# Patient Record
Sex: Male | Born: 1995 | Race: White | Hispanic: No | Marital: Single | Smoking: Current every day smoker
Health system: Southern US, Community
[De-identification: ages and names within clinical notes are randomized; demographics above are authoritative.]

## PROBLEM LIST (undated history)

## (undated) DIAGNOSIS — R002 Palpitations: Secondary | ICD-10-CM

## (undated) DIAGNOSIS — F1210 Cannabis abuse, uncomplicated: Secondary | ICD-10-CM

## (undated) DIAGNOSIS — Z720 Tobacco use: Secondary | ICD-10-CM

## (undated) HISTORY — DX: Palpitations: R00.2

## (undated) HISTORY — DX: Cannabis abuse, uncomplicated: F12.10

## (undated) HISTORY — DX: Tobacco use: Z72.0

## (undated) HISTORY — PX: OTHER SURGICAL HISTORY: SHX169

---
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
## 2017-05-23 ENCOUNTER — Emergency Department
Admission: EM | Admit: 2017-05-23 | Discharge: 2017-05-23 | Disposition: A | Discharge status: Home / Self Care | Payer: Self-pay | Attending: Emergency Medicine | Admitting: Emergency Medicine

## 2017-05-23 ENCOUNTER — Emergency Department: Payer: Self-pay

## 2017-05-23 DIAGNOSIS — F17210 Nicotine dependence, cigarettes, uncomplicated: Secondary | ICD-10-CM | POA: Insufficient documentation

## 2017-05-23 DIAGNOSIS — R0789 Other chest pain: Secondary | ICD-10-CM | POA: Insufficient documentation

## 2017-05-23 LAB — CBC
HEMATOCRIT: 42.3 % (ref 40.0–52.0)
Hemoglobin: 14.6 g/dL (ref 13.0–18.0)
MCH: 30.5 pg (ref 26.0–34.0)
MCHC: 34.6 g/dL (ref 32.0–36.0)
MCV: 88 fL (ref 80.0–100.0)
PLATELETS: 216 10*3/uL (ref 150–440)
RBC: 4.8 MIL/uL (ref 4.40–5.90)
RDW: 13.3 % (ref 11.5–14.5)
WBC: 7.8 10*3/uL (ref 3.8–10.6)

## 2017-05-23 LAB — BASIC METABOLIC PANEL
Anion gap: 9 (ref 5–15)
BUN: 10 mg/dL (ref 6–20)
CHLORIDE: 99 mmol/L — AB (ref 101–111)
CO2: 28 mmol/L (ref 22–32)
CREATININE: 1.01 mg/dL (ref 0.61–1.24)
Calcium: 9.3 mg/dL (ref 8.9–10.3)
GFR calc Af Amer: >60 mL/min (ref >60)
GFR calc non Af Amer: >60 mL/min (ref >60)
GLUCOSE: 55 mg/dL — AB (ref 65–99)
Potassium: 3.5 mmol/L (ref 3.5–5.1)
Sodium: 136 mmol/L (ref 135–145)

## 2017-05-23 LAB — TROPONIN I: Troponin I: <0.03 ng/mL (ref <0.03)

## 2017-05-23 MED ORDER — NICOTINE 14 MG/24HR TD PT24: 14.0000 mg | MEDICATED_PATCH | TRANSDERMAL | 0 refills | Status: DC

## 2017-05-23 NOTE — ED Provider Notes (Signed)
[facility] Emergency Department Provider Note       Time seen: ----------------------------------------- 7:32 PM on 05/23/2017 -----------------------------------------     I have reviewed the triage vital signs and the nursing notes.   HISTORY   Chief Complaint Chest Pain    HPI Jeff [name] is a 21 y.o. male who presents to the ED for chest pain that he has had intermittently for 2-3 weeks. Patient reports approximately a month ago he started on off with chest pain that feels like a pressure. He has had some shortness of breath, admits to daily cigarette use but otherwise no cardiac risk factors. He denies fevers, chills or recent illness.   History reviewed. No pertinent past medical history.  There are no active problems to display for this patient.   History reviewed. No pertinent surgical history.  Allergies Patient has no known allergies.  Social History Social History  Substance Use Topics  . Smoking status: Current Every Day Smoker    Packs/day: 0.50    Types: Cigarettes  . Smokeless tobacco: Never Used  . Alcohol use Yes     Comment: socially 2xmonth    Review of Systems Constitutional: Negative for fever. Eyes: Negative for vision changes ENT:  Negative for congestion, sore throat Cardiovascular: Positive for chest pain Respiratory: Positive for shortness of breath Gastrointestinal: Negative for abdominal pain, vomiting and diarrhea. Genitourinary: Negative for dysuria. Musculoskeletal: Negative for back pain. Skin: Negative for rash. Neurological: Negative for headaches, focal weakness or numbness.  All systems negative/normal/unremarkable except as stated in the HPI  ____________________________________________   PHYSICAL EXAM:  VITAL SIGNS: ED Triage Vitals  Enc Vitals Group     BP 05/23/17 1757 (!) 141/79     Pulse Rate 05/23/17 1757 (!) 116     Resp 05/23/17 1757 16     Temp 05/23/17 1757 98.4 F (36.9  C)     Temp Source 05/23/17 1757 Oral     SpO2 05/23/17 1757 98 %     Weight 05/23/17 1757 135 lb (61.2 kg)     Height 05/23/17 1757 6' (1.829 m)     Head Circumference --      Peak Flow --      Pain Score 05/23/17 1756 5     Pain Loc --      Pain Edu? --      Excl. in GC? --     Constitutional: Alert and oriented. Well appearing and in no distress. Eyes: Conjunctivae are normal. Normal extraocular movements. ENT   Head: Normocephalic and atraumatic.   Nose: No congestion/rhinnorhea.   Mouth/Throat: Mucous membranes are moist.   Neck: No stridor. Cardiovascular: Normal rate, regular rhythm. No murmurs, rubs, or gallops. Respiratory: Normal respiratory effort without tachypnea nor retractions. Breath sounds are clear and equal bilaterally. No wheezes/rales/rhonchi. Gastrointestinal: Soft and nontender. Normal bowel sounds Musculoskeletal: Nontender with normal range of motion in extremities. No lower extremity tenderness nor edema. Neurologic:  Normal speech and language. No gross focal neurologic deficits are appreciated.  Skin:  Skin is warm, dry and intact. No rash noted. Psychiatric: Mood and affect are normal. Speech and behavior are normal.  ____________________________________________  EKG: Interpreted by me. Sinus rhythm with a rate of 93 bpm, normal PR interval, normal QRS, normal QT, normal axis.  ____________________________________________  ED COURSE:  Pertinent labs & imaging results that were available during my care of the patient were reviewed by me and considered in my medical decision making (see chart for  details). Patient presents for chest pain, we will assess with labs and imaging as indicated.   Procedures ____________________________________________   LABS (pertinent positives/negatives)  Labs Reviewed  BASIC METABOLIC PANEL - Abnormal; Notable for the following:       Result Value   Chloride 99 (*)    Glucose, Bld 55 (*)    All  other components within normal limits  CBC  TROPONIN I    RADIOLOGY Images were viewed by me  Chest x-ray is unremarkable  ____________________________________________  FINAL ASSESSMENT AND PLAN  Chest wall pain  Plan: Patient's labs and imaging were dictated above. Patient had presented for chest pain likely secondary to costochondritis. Encouraged to take NSAIDs, I will prescribe nicotine patches for smoking cessation.  Emily FilbertWilliams, Jhair Witherington E, MD   Note: This note was generated in part or whole with voice recognition software. Voice recognition is usually quite accurate but there are transcription errors that can and very often do occur. I apologize for any typographical errors that were not detected and corrected.     Emily FilbertWilliams, Wetzel Meester E, MD 05/23/17 [phone]

## 2017-05-23 NOTE — ED Triage Notes (Signed)
Pt reports that approx a month ago he started with off and on chest pain that feels like pressure in the center of his chest - he denies anxiety but reports shortness of breath - at this time respirations are even and unlabored with no signs of distress - pt denies N/V - pt denies lightheadedness

## 2017-05-23 NOTE — ED Notes (Signed)

## 2017-09-28 ENCOUNTER — Emergency Department
Admission: EM | Admit: 2017-09-28 | Discharge: 2017-09-28 | Disposition: A | Discharge status: Home / Self Care | Payer: Self-pay | Attending: Emergency Medicine | Admitting: Emergency Medicine

## 2017-09-28 ENCOUNTER — Encounter: Payer: Self-pay | Admitting: Emergency Medicine

## 2017-09-28 DIAGNOSIS — K029 Dental caries, unspecified: Secondary | ICD-10-CM | POA: Insufficient documentation

## 2017-09-28 DIAGNOSIS — R05 Cough: Secondary | ICD-10-CM | POA: Insufficient documentation

## 2017-09-28 DIAGNOSIS — K047 Periapical abscess without sinus: Secondary | ICD-10-CM | POA: Insufficient documentation

## 2017-09-28 DIAGNOSIS — B349 Viral infection, unspecified: Secondary | ICD-10-CM | POA: Insufficient documentation

## 2017-09-28 DIAGNOSIS — J069 Acute upper respiratory infection, unspecified: Secondary | ICD-10-CM | POA: Insufficient documentation

## 2017-09-28 DIAGNOSIS — B9789 Other viral agents as the cause of diseases classified elsewhere: Secondary | ICD-10-CM

## 2017-09-28 DIAGNOSIS — B370 Candidal stomatitis: Secondary | ICD-10-CM | POA: Insufficient documentation

## 2017-09-28 DIAGNOSIS — F17210 Nicotine dependence, cigarettes, uncomplicated: Secondary | ICD-10-CM | POA: Insufficient documentation

## 2017-09-28 MED ORDER — AMOXICILLIN 875 MG PO TABS: 875.0000 mg | ORAL_TABLET | Freq: Two times a day (BID) | ORAL | 0 refills | Status: DC

## 2017-09-28 MED ORDER — PSEUDOEPH-BROMPHEN-DM 30-2-10 MG/5ML PO SYRP: 10.0000 mL | ORAL_SOLUTION | Freq: Four times a day (QID) | ORAL | 0 refills | Status: DC | PRN

## 2017-09-28 MED ORDER — MAGIC MOUTHWASH W/LIDOCAINE: 5.0000 mL | Freq: Four times a day (QID) | ORAL | 0 refills | Status: DC

## 2017-09-28 NOTE — ED Provider Notes (Signed)
[facility] Emergency Department Provider Note  ____________________________________________  Time seen: Approximately 5:15 PM  I have reviewed the triage vital signs and the nursing notes.   HISTORY  Chief Complaint Dental Pain    HPI Jeff [name] is a 21 y.o. male presents emergency Department with multiple complaints. Patient reports he has had nasal congestion, sore throat, coughing times. Yesterday he developed right lower dental pain. Patient reports that he has poor dentition but does not have a dentist. His reported pain and swelling to the right lower jaw. Patient has tried Tylenol, Motrin, and BC powders at home. Patient denies any fevers or chills, difficulty breathing or swallowing. No chest pain, short of breath, abdominal pain, nausea vomiting.   History reviewed. No pertinent past medical history.  There are no active problems to display for this patient.   History reviewed. No pertinent surgical history.  Prior to Admission medications   Medication Sig Start Date End Date Taking? Authorizing Provider  amoxicillin (AMOXIL) 875 MG tablet Take 1 tablet (875 mg total) by mouth 2 (two) times daily. 09/28/17   Alainah Phang, Delorise Royals, PA-C  brompheniramine-pseudoephedrine-DM 30-2-10 MG/5ML syrup Take 10 mLs by mouth 4 (four) times daily as needed. 09/28/17   Mikhael Hendriks, Delorise Royals, PA-C  magic mouthwash w/lidocaine SOLN Take 5 mLs by mouth 4 (four) times daily. Swish, gargle and spit 09/28/17   Lilian Fuhs, Delorise Royals, PA-C  nicotine (NICODERM CQ - DOSED IN MG/24 HOURS) 14 mg/24hr patch Place 1 patch (14 mg total) onto the skin daily. 05/23/17 05/23/18  Emily Filbert, MD    Allergies Patient has no known allergies.  No family history on file.  Social History Social History  Substance Use Topics  . Smoking status: Current Every Day Smoker    Packs/day: 0.50    Types: Cigarettes  . Smokeless tobacco: Never Used  . Alcohol use Yes   Comment: socially 2xmonth     Review of Systems  Constitutional: No fever/chills Eyes: No visual changes. No discharge ENT: positive for nasal congestion and sore throat. Positive for right lower dental pain. Cardiovascular: no chest pain. Respiratory:positive cough. No SOB. Gastrointestinal: No abdominal pain.  No nausea, no vomiting.  No diarrhea.  No constipation. Musculoskeletal: Negative for musculoskeletal pain. Skin: Negative for rash, abrasions, lacerations, ecchymosis. Neurological: Negative for headaches, focal weakness or numbness. 10-point ROS otherwise negative.  ____________________________________________   PHYSICAL EXAM:  VITAL SIGNS: ED Triage Vitals  Enc Vitals Group     BP 09/28/17 1707 (!) 133/96     Pulse Rate 09/28/17 1707 (!) 110     Resp 09/28/17 1707 18     Temp 09/28/17 1707 98.6 F (37 C)     Temp Source 09/28/17 1707 Oral     SpO2 09/28/17 1707 98 %     Weight 09/28/17 1708 135 lb (61.2 kg)     Height 09/28/17 1708  (1.803 m)     Head Circumference --      Peak Flow --      Pain Score 09/28/17 1706 10     Pain Loc --      Pain Edu? --      Excl. in GC? --      Constitutional: Alert and oriented. Well appearing and in no acute distress. Eyes: Conjunctivae are normal. PERRL. EOMI. Head: Atraumatic. ENT:      Ears: EACs with cerumen bilaterally. Mild abrasion noted to the left EAC from Q-tip use. TMs are visualized bilaterally, mildly  bulging, nonerythematous with no mucoid air-fluid level.      Nose: mild clear congestion/rhinnorhea.      Mouth/Throat: Mucous membranes are moist. Oropharynx is mildly erythematous but nonedematous. Uvula is midline. Tonsils are mildly erythematous bilaterally. No exudates. Patient has white plaque-liketongue lesion consistent with thrush. Patient has multiple caries, dental erosions, dental fractures throughout dentition. Patient is missing the second molar right lower dentition, this area is  erythematous, edematous consistent with infection. No signs of abscess. Neck: No stridor. Eck is supple for range of motion Hematological/Lymphatic/Immunilogical: No cervical lymphadenopathy. Cardiovascular: Normal rate, regular rhythm. Normal S1 and S2.  Good peripheral circulation. Respiratory: Normal respiratory effort without tachypnea or retractions. Lungs CTAB. Good air entry to the bases with no decreased or absent breath sounds. Musculoskeletal: Full range of motion to all extremities. No gross deformities appreciated. Neurologic:  Normal speech and language. No gross focal neurologic deficits are appreciated.  Skin:  Skin is warm, dry and intact. No rash noted. Psychiatric: Mood and affect are normal. Speech and behavior are normal. Patient exhibits appropriate insight and judgement.   ____________________________________________   LABS (all labs ordered are listed, but only abnormal results are displayed)  Labs Reviewed - No data to display ____________________________________________  EKG   ____________________________________________  RADIOLOGY   No results found.  ____________________________________________    PROCEDURES  Procedure(s) performed:    Procedures    Medications - No data to display   ____________________________________________   INITIAL IMPRESSION / ASSESSMENT AND PLAN / ED COURSE  Pertinent labs & imaging results that were available during my care of the patient were reviewed by me and considered in my medical decision making (see chart for details).  Review of the [place] CSRS was performed in accordance of the [facility] prior to dispensing any controlled drugs.     Patient's diagnosis is consistent with viral upper respiratory infection, oral thrush, and dental infection with multiple dental caries.. Patient will be discharged home with prescriptions for Bromfed cough syrup for viral URI,amoxicillin for dental infection, magic mouthwash  for thrush and dental pain.. Patient is to follow up with dentist and/or primary care as needed or otherwise directed. Patient is given ED precautions to return to the ED for any worsening or new symptoms.     ____________________________________________  FINAL CLINICAL IMPRESSION(S) / ED DIAGNOSES  Final diagnoses:  Dental infection  Dental caries  Thrush  Viral URI with cough      NEW MEDICATIONS STARTED DURING THIS VISIT:  New Prescriptions   AMOXICILLIN (AMOXIL) 875 MG TABLET    Take 1 tablet (875 mg total) by mouth 2 (two) times daily.   BROMPHENIRAMINE-PSEUDOEPHEDRINE-DM 30-2-10 MG/5ML SYRUP    Take 10 mLs by mouth 4 (four) times daily as needed.   MAGIC MOUTHWASH W/LIDOCAINE SOLN    Take 5 mLs by mouth 4 (four) times daily. Swish, gargle and spit        This chart was dictated using voice recognition software/Dragon. Despite best efforts to proofread, errors can occur which can change the meaning. Any change was purely unintentional.    Racheal Patches, PA-C 09/28/17 1738    Sharman Cheek, MD 09/28/17 1843

## 2017-09-28 NOTE — Discharge Instructions (Signed)
OPTIONS FOR DENTAL FOLLOW UP CARE ° °[place] Department of Health and Human Services - Local Safety Net Dental Clinics °http://www.ncdhhs.gov/dph/oralhealth/services/safetynetclinics.htm °  °[facility] ([phone]) ° °[facility] ([phone]) ° °[facility] ([phone] ext [phone]) ° °[facility] ([phone]) ° °[facility] ([phone]) °This clinic caters to the indigent population and is on a lottery system. °Location: °[facility], [address], [place] °Clinic Hours: °Wednesdays from 6pm - 9pm, patients seen by a lottery system. °For dates, call or go to [email or web address] °Services: °Cleanings, fillings and simple extractions. °Payment Options: °DENTAL WORK IS FREE OF CHARGE. Bring proof of income or support. °Best way to get seen: °Arrive at 5:15 pm - this is a lottery, NOT first come/first serve, so arriving earlier will not increase your chances of being seen. °  °  °[facility] °[phone] °Select option 1 for emergencies °  °Location: °[facility], [address], [place] °Clinic Hours: °No walk-ins accepted - call the day before to schedule an appointment. °Check in times are 9:30 am and 1:30 pm. °Services: °Simple extractions, temporary fillings, pulpectomy/pulp debridement, uncomplicated abscess drainage. °Payment Options: °PAYMENT IS DUE AT THE TIME OF SERVICE.  Fee is usually $100-200, additional surgical procedures (e.g. abscess drainage) may be extra. °Cash, checks, Visa/MasterCard accepted.  Can file Medicaid if patient is covered for dental - patient should call case worker to check. °No discount for [facility] patients. °Best way to get seen: °MUST call the day before and get onto the schedule. Can usually be seen the next 1-2 days. No walk-ins accepted. °  °  °[facility] °[phone] °   °Location: °[facility], [address], [place] °Clinic Hours: °M, W, Th, F 8am or 1:30pm, Tues 9a or 1:30 - first come/first served. °Services: °Simple extractions, temporary fillings, uncomplicated abscess drainage.  You do not need to be an [place] resident. °Payment Options: °PAYMENT IS DUE AT THE TIME OF SERVICE. °Dental insurance, otherwise sliding scale - bring proof of income or support. °Depending on income and treatment needed, cost is usually $50-200. °Best way to get seen: °Arrive early as it is first come/first served. °  °  °[facility] °[phone] °  °Location: °[address] °Clinic Hours: °Mon-Thu 8a-5p °Services: °Most basic dental services including extractions and fillings. °Payment Options: °PAYMENT IS DUE AT THE TIME OF SERVICE. °Sliding scale, up to 50% off - bring proof if income or support. °Medicaid with dental option accepted. °Best way to get seen: °Call to schedule an appointment, can usually be seen within 2 weeks OR they will try to see walk-ins - show up at 8a or 2p (you may have to wait). °  °  °[facility] °[phone] °[place] RESIDENTS ONLY °  °Location: °[facility], [address], [place], [place] [address] °Clinic Hours: By appointment only. °Monday - Thursday 8am-5pm, Friday 8am-12pm °Services: Cleanings, fillings, extractions. °Payment Options: °PAYMENT IS DUE AT THE TIME OF SERVICE. °Cash, Visa or MasterCard. Sliding scale - $30 minimum per service. °Best way to get seen: °Come in to office, complete packet and make an appointment - need proof of income °or support monies for each household member and proof of [place] residence. °Usually takes about a month to get in. °  °  °[facility] °[phone] °  °Location: °[address]   [place] °Clinic Hours: Walk-in Urgent Care Dental Services are offered Monday-Friday  mornings only. °The numbers of emergencies accepted daily is limited to the number of °providers available. °Maximum 15 - Mondays, Wednesdays & Thursdays °Maximum 10 - Tuesdays & Fridays °Services: °You do not need to be a [place] resident to be seen for a dental emergency. °Emergencies are defined as pain, swelling, abnormal bleeding, or dental trauma. Walkins will receive x-rays if needed. °NOTE: Dental cleaning is not an emergency. °Payment Options: °PAYMENT IS DUE AT THE TIME OF SERVICE. °Minimum co-pay is $40.00 for uninsured patients. °Minimum co-pay is $3.00 for Medicaid with dental coverage. °Dental Insurance is accepted and must be presented at time of visit. °Medicare does not cover dental. °Forms of payment: Cash, credit card, checks. °Best way to get seen: °If not previously registered with the clinic, walk-in dental registration begins at 7:15 am and is on a first come/first serve basis. °If previously registered with the clinic, call to make an appointment. °  °  °[facility] °[phone] °[place] RESIDENTS ONLY °  °Location: °[address], [place], [place] °Clinic Hours: °Mon-Thu 10a-2p °Services: Extractions only! °Payment Options: °FREE (donations accepted) - bring proof of income or support °Best way to get seen: °Call and schedule an appointment OR come at 8am on the 1st Monday of every month (except for holidays) when it is first come/first served. °  °  °[facility] °[phone] °  °Location: °[address], [place] °Clinic Hours: °Friday mornings °Services, Payment Options, Best way to get seen: °Call for info °

## 2017-09-28 NOTE — ED Triage Notes (Signed)
Lower R dental pain since yesterday.

## 2018-04-21 ENCOUNTER — Encounter: Payer: Self-pay | Admitting: Emergency Medicine

## 2018-04-21 ENCOUNTER — Other Ambulatory Visit: Payer: Self-pay

## 2018-04-21 ENCOUNTER — Emergency Department
Admission: EM | Admit: 2018-04-21 | Discharge: 2018-04-21 | Disposition: A | Discharge status: Home / Self Care | Payer: Self-pay | Attending: Emergency Medicine | Admitting: Emergency Medicine

## 2018-04-21 DIAGNOSIS — K029 Dental caries, unspecified: Secondary | ICD-10-CM | POA: Insufficient documentation

## 2018-04-21 DIAGNOSIS — F17210 Nicotine dependence, cigarettes, uncomplicated: Secondary | ICD-10-CM | POA: Insufficient documentation

## 2018-04-21 MED ORDER — AMOXICILLIN 500 MG PO CAPS: 500.0000 mg | ORAL_CAPSULE | Freq: Three times a day (TID) | ORAL | 0 refills | Status: DC

## 2018-04-21 NOTE — Discharge Instructions (Addendum)
OPTIONS FOR DENTAL FOLLOW UP CARE ° °[place] Department of Health and Human Services - Local Safety Net Dental Clinics °http://www.ncdhhs.gov/dph/oralhealth/services/safetynetclinics.htm °  °[facility] ([phone]) ° °[facility] ([phone]) ° °[facility] ([phone] ext [phone]) ° ° [facility] ([phone]) ° °[facility] ([phone]) °This clinic caters to the indigent population and is on a lottery system. °Location: °[facility], [address], [place] °Clinic Hours: °Wednesdays from 6pm - 9pm, patients seen by a lottery system. °For dates, call or go to [email or web address] °Services: °Cleanings, fillings and simple extractions. °Payment Options: °DENTAL WORK IS FREE OF CHARGE. Bring proof of income or support. °Best way to get seen: °Arrive at 5:15 pm - this is a lottery, NOT first come/first serve, so arriving earlier will not increase your chances of being seen. °  °  °[facility] °[phone] °Select option 1 for emergencies °  °Location: °[facility], [address], [place] °Clinic Hours: °No walk-ins accepted - call the day before to schedule an appointment. °Check in times are 9:30 am and 1:30 pm. °Services: °Simple extractions, temporary fillings, pulpectomy/pulp debridement, uncomplicated abscess drainage. °Payment Options: °PAYMENT IS DUE AT THE TIME OF SERVICE.  Fee is usually $100-200, additional surgical procedures (e.g. abscess drainage) may be extra. °Cash, checks, Visa/MasterCard accepted.  Can file Medicaid if patient is covered for dental - patient should call case worker to check. °No discount for [facility] patients. °Best way to get seen: °MUST call the day before and get onto the schedule. Can usually be seen the next 1-2 days. No walk-ins accepted. °  °  °[facility] °[phone] °   °Location: °[facility], [address], [place] °Clinic Hours: °M, W, Th, F 8am or 1:30pm, Tues 9a or 1:30 - first come/first served. °Services: °Simple extractions, temporary fillings, uncomplicated abscess drainage.  You do not need to be an [place] resident. °Payment Options: °PAYMENT IS DUE AT THE TIME OF SERVICE. °Dental insurance, otherwise sliding scale - bring proof of income or support. °Depending on income and treatment needed, cost is usually $50-200. °Best way to get seen: °Arrive early as it is first come/first served. °  °  °[facility] °[phone] °  °Location: °[address] °Clinic Hours: °Mon-Thu 8a-5p °Services: °Most basic dental services including extractions and fillings. °Payment Options: °PAYMENT IS DUE AT THE TIME OF SERVICE. °Sliding scale, up to 50% off - bring proof if income or support. °Medicaid with dental option accepted. °Best way to get seen: °Call to schedule an appointment, can usually be seen within 2 weeks OR they will try to see walk-ins - show up at 8a or 2p (you may have to wait). °  °  °[facility] °[phone] °[place] RESIDENTS ONLY °  °Location: °[facility], [address], [place], [place] [address] °Clinic Hours: By appointment only. °Monday - Thursday 8am-5pm, Friday 8am-12pm °Services: Cleanings, fillings, extractions. °Payment Options: °PAYMENT IS DUE AT THE TIME OF SERVICE. °Cash, Visa or MasterCard. Sliding scale - $30 minimum per service. °Best way to get seen: °Come in to office, complete packet and make an appointment - need proof of income °or support monies for each household member and proof of [place] residence. °Usually takes about a month to get in. °  °  °[facility] °[phone] °  °Location: °[address]   [place] °Clinic Hours: Walk-in Urgent Care Dental Services are offered Monday-Friday  mornings only. °The numbers of emergencies accepted daily is limited to the number of °providers available. °Maximum 15 - Mondays, Wednesdays & Thursdays °Maximum 10 - Tuesdays & Fridays °Services: °You do not need to be a [place] resident to be seen for a dental emergency. °Emergencies are defined as pain, swelling, abnormal bleeding, or dental trauma. Walkins will receive x-rays if needed. °NOTE: Dental cleaning is not an emergency. °Payment Options: °PAYMENT IS DUE AT THE TIME OF SERVICE. °Minimum co-pay is $40.00 for uninsured patients. °Minimum co-pay is $3.00 for Medicaid with dental coverage. °Dental Insurance is accepted and must be presented at time of visit. °Medicare does not cover dental. °Forms of payment: Cash, credit card, checks. °Best way to get seen: °If not previously registered with the clinic, walk-in dental registration begins at 7:15 am and is on a first come/first serve basis. °If previously registered with the clinic, call to make an appointment. °  °  °[facility] °[phone] °[place] RESIDENTS ONLY °  °Location: °[address], [place], [place] °Clinic Hours: °Mon-Thu 10a-2p °Services: Extractions only! °Payment Options: °FREE (donations accepted) - bring proof of income or support °Best way to get seen: °Call and schedule an appointment OR come at 8am on the 1st Monday of every month (except for holidays) when it is first come/first served. °  °  °[facility] °[phone] °  °Location: °[address], [place] °Clinic Hours: °Friday mornings °Services, Payment Options, Best way to get seen: °Call for info °

## 2018-04-21 NOTE — ED Triage Notes (Signed)
C/O left lower jaw dental pain / abscess.

## 2018-04-21 NOTE — ED Provider Notes (Signed)
[facility] Emergency Department Provider Note  ____________________________________________   First MD Initiated Contact with Patient 04/21/18 1016     (approximate)  I have reviewed the triage vital signs and the nursing notes.   HISTORY  Chief Complaint Dental Pain    HPI TAHIR BLANK is a 22 y.o. male since emergency department complaining of questionable dental abscess to the left lower jaw.  He states  pain in the jaw for several days.  History reviewed. No pertinent past medical history.  There are no active problems to display for this patient.   History reviewed. No pertinent surgical history.  Prior to Admission medications   Medication Sig Start Date End Date Taking? Authorizing Provider  amoxicillin (AMOXIL) 500 MG capsule Take 1 capsule (500 mg total) by mouth 3 (three) times daily. 04/21/18   Faythe Ghee, PA-C    Allergies Patient has no known allergies.  No family history on file.  Social History Social History   Tobacco Use  . Smoking status: Current Every Day Smoker    Packs/day: 0.50    Types: Cigarettes  . Smokeless tobacco: Never Used  Substance Use Topics  . Alcohol use: Yes    Comment: socially 2xmonth  . Drug use: Yes    Types: Marijuana    Comment: daily    Review of Systems  Constitutional: No fever/chills Eyes: No visual changes. ENT: No sore throat.  Positive for dental caries and jaw pain Respiratory: Denies cough Genitourinary: Negative for dysuria. Musculoskeletal: Negative for back pain. Skin: Negative for rash.    ____________________________________________   PHYSICAL EXAM:  VITAL SIGNS: ED Triage Vitals  Enc Vitals Group     BP 04/21/18 1011 (!) 147/93     Pulse Rate 04/21/18 1011 77     Resp 04/21/18 1011 16     Temp 04/21/18 1011 (!) 97.5 F (36.4 C)     Temp src --      SpO2 04/21/18 1011 98 %     Weight 04/21/18 1008 145 lb (65.8 kg)     Height 04/21/18 1008   (1.803 m)     Head Circumference --      Peak Flow --      Pain Score 04/21/18 1008 10     Pain Loc --      Pain Edu? --      Excl. in GC? --     Constitutional: Alert and oriented. Well appearing and in no acute distress. Eyes: Conjunctivae are normal.  Head: Atraumatic. Nose: No congestion/rhinnorhea. Mouth/Throat: Mucous membranes are moist.  The patient has widespread poor dentition.  Multiple caries are noted.  There is no swelling noted.  The patient actually points to the right side of the jaw most of the left. Neck: Is supple, no lymphadenopathy is noted Cardiovascular: Normal rate, regular rhythm.  Heart sounds are normal Respiratory: Normal respiratory effort.  No retractions, lungs are clear to auscultation  GU: deferred Musculoskeletal: FROM all extremities, warm and well perfused Neurologic:  Normal speech and language.  Skin:  Skin is warm, dry and intact. No rash noted. Psychiatric: Mood and affect are normal. Speech and behavior are normal.  ____________________________________________   LABS (all labs ordered are listed, but only abnormal results are displayed)  Labs Reviewed - No data to display ____________________________________________   ____________________________________________  RADIOLOGY    ____________________________________________   PROCEDURES  Procedure(s) performed: No  Procedures    ____________________________________________   INITIAL IMPRESSION /  ASSESSMENT AND PLAN / ED COURSE  Pertinent labs & imaging results that were available during my care of the patient were reviewed by me and considered in my medical decision making (see chart for details).  Patient is a 22 year old male presents emergency department complaining of dental pain.  In triage she stated left side.  While he was being examined he told me the right side.  On physical exam the patient has widespread poor dentition with multiple caries.  No swelling is  noted.  Explained the findings to the patient.  Explained that he really needs to see a dentist.  He was given a prescription for amoxicillin, he is to take over-the-counter ibuprofen with this medication.  He should follow-up with a dentist.  The dental clinic list was given to him at discharge.  He states he understands to comply with instructions.  Was discharged in stable condition     As part of my medical decision making, I reviewed the following data within the electronic MEDICAL RECORD NUMBER Nursing notes reviewed and incorporated, Old chart reviewed, Notes from prior ED visits and [place] Controlled Substance Database  ____________________________________________   FINAL CLINICAL IMPRESSION(S) / ED DIAGNOSES  Final diagnoses:  Dental caries      NEW MEDICATIONS STARTED DURING THIS VISIT:  Discharge Medication List as of 04/21/2018 10:29 AM    START taking these medications   Details  amoxicillin (AMOXIL) 500 MG capsule Take 1 capsule (500 mg total) by mouth 3 (three) times daily., Starting Mon 04/21/2018, Print         Note:  This document was prepared using Dragon voice recognition software and may include unintentional dictation errors.    Faythe Ghee, PA-C 04/21/18 1559    Jene Every, MD 04/22/18 [phone]

## 2018-04-21 NOTE — ED Notes (Signed)
See triage note.  Presents with possible dental abscess area to left lower jaw area  Pain started couple of days ago

## 2018-04-22 ENCOUNTER — Encounter: Payer: Self-pay | Admitting: Emergency Medicine

## 2018-04-22 ENCOUNTER — Emergency Department
Admission: EM | Admit: 2018-04-22 | Discharge: 2018-04-22 | Disposition: A | Discharge status: Home / Self Care | Payer: Self-pay | Attending: Emergency Medicine | Admitting: Emergency Medicine

## 2018-04-22 ENCOUNTER — Other Ambulatory Visit: Payer: Self-pay

## 2018-04-22 ENCOUNTER — Emergency Department: Payer: Self-pay

## 2018-04-22 DIAGNOSIS — F17210 Nicotine dependence, cigarettes, uncomplicated: Secondary | ICD-10-CM | POA: Insufficient documentation

## 2018-04-22 DIAGNOSIS — R000 Tachycardia, unspecified: Secondary | ICD-10-CM | POA: Insufficient documentation

## 2018-04-22 DIAGNOSIS — F1299 Cannabis use, unspecified with unspecified cannabis-induced disorder: Secondary | ICD-10-CM | POA: Insufficient documentation

## 2018-04-22 LAB — URINE DRUG SCREEN, QUALITATIVE (ARMC ONLY)
Amphetamines, Ur Screen: NOT DETECTED
Barbiturates, Ur Screen: NOT DETECTED
Benzodiazepine, Ur Scrn: NOT DETECTED
COCAINE METABOLITE, UR ~~LOC~~: NOT DETECTED
Cannabinoid 50 Ng, Ur ~~LOC~~: POSITIVE — AB
MDMA (ECSTASY) UR SCREEN: NOT DETECTED
METHADONE SCREEN, URINE: NOT DETECTED
Opiate, Ur Screen: NOT DETECTED
Phencyclidine (PCP) Ur S: NOT DETECTED
TRICYCLIC, UR SCREEN: POSITIVE — AB

## 2018-04-22 LAB — CBC
HEMATOCRIT: 43 % (ref 40.0–52.0)
HEMOGLOBIN: 14.9 g/dL (ref 13.0–18.0)
MCH: 30.9 pg (ref 26.0–34.0)
MCHC: 34.7 g/dL (ref 32.0–36.0)
MCV: 89 fL (ref 80.0–100.0)
Platelets: 221 10*3/uL (ref 150–440)
RBC: 4.84 MIL/uL (ref 4.40–5.90)
RDW: 13.1 % (ref 11.5–14.5)
WBC: 10.8 10*3/uL — AB (ref 3.8–10.6)

## 2018-04-22 LAB — BASIC METABOLIC PANEL
ANION GAP: 8 (ref 5–15)
BUN: 9 mg/dL (ref 6–20)
CO2: 29 mmol/L (ref 22–32)
Calcium: 9.2 mg/dL (ref 8.9–10.3)
Chloride: 101 mmol/L (ref 101–111)
Creatinine, Ser: 0.95 mg/dL (ref 0.61–1.24)
GFR calc Af Amer: >60 mL/min (ref >60)
GLUCOSE: 86 mg/dL (ref 65–99)
POTASSIUM: 3.2 mmol/L — AB (ref 3.5–5.1)
SODIUM: 138 mmol/L (ref 135–145)

## 2018-04-22 LAB — TROPONIN I: Troponin I: <0.03 ng/mL (ref <0.03)

## 2018-04-22 MED ORDER — LORAZEPAM 2 MG/ML IJ SOLN
0.5000 mg | Freq: Once | INTRAMUSCULAR | Status: AC
Start: 2018-04-22 — End: 2018-04-22
  Administered 2018-04-22: 0.5 mg via INTRAVENOUS
  Filled 2018-04-22: qty 1

## 2018-04-22 MED ORDER — SODIUM CHLORIDE 0.9 % IV BOLUS
1000.0000 mL | Freq: Once | INTRAVENOUS | Status: AC
  Administered 2018-04-22: 1000 mL via INTRAVENOUS

## 2018-04-22 MED ORDER — IBUPROFEN 100 MG/5ML PO SUSP
600.0000 mg | Freq: Once | ORAL | Status: AC
  Administered 2018-04-22: 600 mg via ORAL
  Filled 2018-04-22: qty 30

## 2018-04-22 NOTE — ED Provider Notes (Signed)
[facility] Emergency Department Provider Note  Time seen: 8:04 PM  I have reviewed the triage vital signs and the nursing notes.   HISTORY  Chief Complaint Tachycardia and Chest Pain    HPI Jeff [name] is a 22 y.o. male with no past medical history who presents to the emergency department for palpitations and chest discomfort.  According to the patient for the past 2 weeks he has been experiencing right lower tooth pain, states he has been taking amoxicillin for this, has a dental appointment on Thursday.  Patient states approximately 2 to 3 hours prior to arrival he took a THC edible.  States ever since taking the interval he has had palpitations feeling like his heart is racing with chest tightness which he describes as moderate now very mild.  States mild shortness of breath earlier but that is gone as well.  Denies any fever.  Largely negative review of systems otherwise.  Patient states he does use marijuana before but has never ingested it.   History reviewed. No pertinent past medical history.  There are no active problems to display for this patient.   History reviewed. No pertinent surgical history.  Prior to Admission medications   Medication Sig Start Date End Date Taking? Authorizing Provider  amoxicillin (AMOXIL) 500 MG capsule Take 1 capsule (500 mg total) by mouth 3 (three) times daily. 04/21/18   Faythe Ghee, PA-C    No Known Allergies  History reviewed. No pertinent family history.  Social History Social History   Tobacco Use  . Smoking status: Current Every Day Smoker    Packs/day: 0.50    Types: Cigarettes  . Smokeless tobacco: Never Used  Substance Use Topics  . Alcohol use: Yes    Comment: socially 2xmonth  . Drug use: Yes    Types: Marijuana    Comment: daily    Review of Systems Constitutional: Negative for fever. Eyes: Negative for visual complaints ENT: Negative for recent illness/congestion Cardiovascular:  Mild chest pain earlier, now gone Respiratory: Mild shortness of breath earlier, now gone Gastrointestinal: Negative for abdominal pain.  Positive for nausea earlier now gone.  Denies vomiting. Genitourinary: Negative for urinary compaints Musculoskeletal: Negative for musculoskeletal complaints Skin: Negative for skin complaints  Neurological: Negative for headache All other ROS negative  ____________________________________________   PHYSICAL EXAM:  VITAL SIGNS: ED Triage Vitals [04/22/18 1838]  Enc Vitals Group     BP 129/85     Pulse Rate (!) 170     Resp 18     Temp 98.5 F (36.9 C)     Temp Source Oral     SpO2 100 %     Weight 145 lb (65.8 kg)     Height  (1.803 m)     Head Circumference      Peak Flow      Pain Score 8     Pain Loc      Pain Edu?      Excl. in GC?     Constitutional: Alert and oriented. Well appearing and in no distress. Eyes: Normal exam ENT   Head: Normocephalic and atraumatic.   Mouth/Throat: Mucous membranes are moist.  Poor dentition.  Patient has a fractured right lower molar where his pain is but no signs of abscess. Cardiovascular: Regular rhythm, rate around 130 bpm.  No murmur. Respiratory: Normal respiratory effort without tachypnea nor retractions. Breath sounds are clear and equal bilaterally. No wheezes/rales/rhonchi. Gastrointestinal: Soft and nontender. No  distention.   Musculoskeletal: Nontender with normal range of motion in all extremities.  Neurologic:  Normal speech and language. No gross focal neurologic deficits  Skin:  Skin is warm, dry and intact.  Psychiatric: Mood and affect are normal.   ____________________________________________    EKG  EKG reviewed and interpreted by myself shows sinus tachycardia 166 bpm with a narrow QRS, normal axis, normal intervals, nonspecific ST changes.  ____________________________________________    RADIOLOGY  Chest x-ray  negative  ____________________________________________   INITIAL IMPRESSION / ASSESSMENT AND PLAN / ED COURSE  Pertinent labs & imaging results that were available during my care of the patient were reviewed by me and considered in my medical decision making (see chart for details).  Patient presents to the emergency department for tachycardia and chest discomfort.  Patient admits to using a THC edible approximately 2 to 3 hours ago when his symptoms started.  Patient currently calm and cooperative mildly anxious in appearance, heart rate remains around 130 bpm.  Differential would include THC intoxication/substance use, ACS, arrhythmia.  Labs are largely within normal limits including negative troponin.  EKG shows significant tachycardia but otherwise normal.  Heart rate is currently 125 bpm.  We will continue to closely monitor the patient on telemetry.  We will dose an additional liter of fluid and dose 0.5 mg of IV Ativan.  Patient agreeable to plan.  Rate currently 85 bpm.  Patient appears much improved.  Highly suspect THC ingestion to be the cause of his symptoms.  We will discharge from the emergency department.  Requesting ibuprofen for his toothache.  ____________________________________________   FINAL CLINICAL IMPRESSION(S) / ED DIAGNOSES  Tachycardia Chest pain    Minna Antis, MD 04/22/18 2302

## 2018-04-22 NOTE — ED Notes (Signed)
EKG signed by Dr. Scotty Court, VORB to start bolus of normal saline for patient, instructed to place patient in subwait area.

## 2018-04-22 NOTE — ED Triage Notes (Signed)
Pt to ED from home c/o chest pain and tachycardia, states ate an edible a couple hours ago that his friend made only with THC, first time eating edible but has smoked marijuana before.  Patient with palpitations, some chest discomfort, nausea.

## 2018-04-22 NOTE — Discharge Instructions (Addendum)
Please drink plenty fluids.  Avoid drugs and alcohol.  Follow-up with your doctor for recheck/reevaluation.  Return to the emergency department for any personally concerning symptoms.

## 2018-05-15 ENCOUNTER — Emergency Department: Payer: Self-pay

## 2018-05-15 ENCOUNTER — Emergency Department
Admission: EM | Admit: 2018-05-15 | Discharge: 2018-05-16 | Disposition: A | Discharge status: Home / Self Care | Payer: Self-pay | Attending: Emergency Medicine | Admitting: Emergency Medicine

## 2018-05-15 DIAGNOSIS — B9789 Other viral agents as the cause of diseases classified elsewhere: Secondary | ICD-10-CM | POA: Insufficient documentation

## 2018-05-15 DIAGNOSIS — E860 Dehydration: Secondary | ICD-10-CM | POA: Insufficient documentation

## 2018-05-15 DIAGNOSIS — Z79899 Other long term (current) drug therapy: Secondary | ICD-10-CM | POA: Insufficient documentation

## 2018-05-15 DIAGNOSIS — J988 Other specified respiratory disorders: Secondary | ICD-10-CM | POA: Insufficient documentation

## 2018-05-15 DIAGNOSIS — F17210 Nicotine dependence, cigarettes, uncomplicated: Secondary | ICD-10-CM | POA: Insufficient documentation

## 2018-05-15 LAB — CBC
HCT: 39.6 % — ABNORMAL LOW (ref 40.0–52.0)
Hemoglobin: 13.8 g/dL (ref 13.0–18.0)
MCH: 30.6 pg (ref 26.0–34.0)
MCHC: 34.9 g/dL (ref 32.0–36.0)
MCV: 87.5 fL (ref 80.0–100.0)
Platelets: 203 10*3/uL (ref 150–440)
RBC: 4.53 MIL/uL (ref 4.40–5.90)
RDW: 13 % (ref 11.5–14.5)
WBC: 12.8 10*3/uL — AB (ref 3.8–10.6)

## 2018-05-15 LAB — TROPONIN I

## 2018-05-15 LAB — BASIC METABOLIC PANEL
Anion gap: 9 (ref 5–15)
BUN: 9 mg/dL (ref 6–20)
CALCIUM: 9.4 mg/dL (ref 8.9–10.3)
CO2: 27 mmol/L (ref 22–32)
Chloride: 100 mmol/L — ABNORMAL LOW (ref 101–111)
Creatinine, Ser: 0.92 mg/dL (ref 0.61–1.24)
GFR calc Af Amer: >60 mL/min (ref >60)
GLUCOSE: 90 mg/dL (ref 65–99)
Potassium: 3.4 mmol/L — ABNORMAL LOW (ref 3.5–5.1)
SODIUM: 136 mmol/L (ref 135–145)

## 2018-05-15 MED ORDER — POTASSIUM CHLORIDE CRYS ER 20 MEQ PO TBCR
40.0000 meq | EXTENDED_RELEASE_TABLET | Freq: Once | ORAL | Status: AC
  Administered 2018-05-15: 40 meq via ORAL
  Filled 2018-05-15: qty 2

## 2018-05-15 MED ORDER — DEXTROSE-NACL 5-0.45 % IV SOLN
Freq: Once | INTRAVENOUS | Status: AC
  Administered 2018-05-15: 21:00:00 via INTRAVENOUS

## 2018-05-15 MED ORDER — ONDANSETRON 4 MG PO TBDP: 4.0000 mg | ORAL_TABLET | Freq: Three times a day (TID) | ORAL | 0 refills | Status: DC | PRN

## 2018-05-15 NOTE — ED Notes (Signed)
Patient transported to X-ray 

## 2018-05-15 NOTE — ED Triage Notes (Addendum)
Patient c/o medial chest pain, palpitations,  SOB, dizziness, light-headedness, and nausea.   Patient c/o productive cough chest with green, blood-tinged sputum.   Patient reports being seen in this ED for similar episode 3 weeks ago. Patient reports his HR was 170 bpm at that time.

## 2018-05-15 NOTE — ED Provider Notes (Signed)
[facility] Emergency Department Provider Note  ____________________________________________  Time seen: Approximately 11:19 PM  I have reviewed the triage vital signs and the nursing notes.   HISTORY  Chief Complaint Chest Pain    HPI [name] is a 22 y.o. male who complains of central chest pain worse with coughing, no alleviating factors, nonradiating, associated with shortness of breath and cough productive of green sputum for the past 24 hours. Also has body aches malaise and decreased appetite. Feels dehydrated. Denies fever or chills. Moderate intensity.  Nonexertional, nonpleuritic.  Patient complains of episodes of racing heartbeat in the past without dizziness or syncope. No exertional symptoms.      History reviewed. No pertinent past medical history.   There are no active problems to display for this patient.    History reviewed. No pertinent surgical history.   Prior to Admission medications   Medication Sig Start Date End Date Taking? Authorizing Provider  amoxicillin (AMOXIL) 500 MG capsule Take 1 capsule (500 mg total) by mouth 3 (three) times daily. 04/21/18   Sherrie Mustache Roselyn Bering, PA-C  ondansetron (ZOFRAN ODT) 4 MG disintegrating tablet Take 1 tablet (4 mg total) by mouth every 8 (eight) hours as needed for nausea or vomiting. 05/15/18   Sharman Cheek, MD     Allergies Patient has no known allergies.   No family history on file.  Social History Social History   Tobacco Use  . Smoking status: Current Every Day Smoker    Packs/day: 0.50    Types: Cigarettes  . Smokeless tobacco: Never Used  Substance Use Topics  . Alcohol use: Yes    Comment: socially 2xmonth  . Drug use: Yes    Types: Marijuana    Comment: daily    Review of Systems  Constitutional:   No fever or chills.  ENT:   No sore throat. No rhinorrhea. Cardiovascular:   positive as above chest pain without syncope. Respiratory:   positive  shortness of breath and cough. Gastrointestinal:   Negative for abdominal pain, vomiting and diarrhea.  Musculoskeletal:   Negative for focal pain or swelling All other systems reviewed and are negative except as documented above in ROS and HPI.  ____________________________________________   PHYSICAL EXAM:  VITAL SIGNS: ED Triage Vitals [05/15/18 1935]  Enc Vitals Group     BP 122/79     Pulse Rate (!) 123     Resp 19     Temp 100.1 F (37.8 C)     Temp Source Oral     SpO2 100 %     Weight 145 lb (65.8 kg)     Height      Head Circumference      Peak Flow      Pain Score 7     Pain Loc      Pain Edu?      Excl. in GC?     Vital signs reviewed, nursing assessments reviewed.   Constitutional:   Alert and oriented. Well appearing and in no distress. Eyes:   Conjunctivae are normal. EOMI. PERRL. ENT      Head:   Normocephalic and atraumatic.      Nose:   No congestion/rhinnorhea.       Mouth/Throat:   dry mucous membranes, no pharyngeal erythema. No peritonsillar mass.       Neck:   No meningismus. Full ROM. Hematological/Lymphatic/Immunilogical:   No cervical lymphadenopathy. Cardiovascular:   RRR heart rate 90 supine, increases to 110 sitting  upright.. Symmetric bilateral radial and DP pulses.  No murmurs.  Respiratory:   Normal respiratory effort without tachypnea/retractions. Breath sounds are clear and equal bilaterally. No wheezes/rales/rhonchi. Gastrointestinal:   Soft and nontender. Non distended. There is no CVA tenderness.  No rebound, rigidity, or guarding.  Musculoskeletal:   Normal range of motion in all extremities. No joint effusions.  No lower extremity tenderness.  No edema. Neurologic:   Normal speech and language.  Motor grossly intact. No acute focal neurologic deficits are appreciated.  Skin:    Skin is warm, dry and intact. No rash noted.  No petechiae, purpura, or bullae.  ____________________________________________    LABS (pertinent  positives/negatives) (all labs ordered are listed, but only abnormal results are displayed) Labs Reviewed  BASIC METABOLIC PANEL - Abnormal; Notable for the following components:      Result Value   Potassium 3.4 (*)    Chloride 100 (*)    All other components within normal limits  CBC - Abnormal; Notable for the following components:   WBC 12.8 (*)    HCT 39.6 (*)    All other components within normal limits  TROPONIN I   ____________________________________________   EKG  interpreted by me Sinus tachycardia rate 118, normal axis and intervals. A QRS ST segments and T waves. Large P waves suggestive of biatrial enlargement.  ____________________________________________    RADIOLOGY  Dg Chest 2 View  Result Date: 05/15/2018 CLINICAL DATA:  Medial chest pain and palpitations with dyspnea and dizziness. EXAM: CHEST - 2 VIEW COMPARISON:  04/22/2018 FINDINGS: The heart size and mediastinal contours are within normal limits. Both lungs are clear. The visualized skeletal structures are unremarkable. IMPRESSION: No active cardiopulmonary disease. Electronically Signed   By: Tollie Eth M.D.   On: 05/15/2018 19:54    ____________________________________________   PROCEDURES Procedures  ____________________________________________  DIFFERENTIAL DIAGNOSIS   pneumonia, viral bronchitis, dehydration. Low suspicion of pneumothorax PE ACS dissection soft tissue infection or sepsis. Doubt pericarditis or myocarditis.  CLINICAL IMPRESSION / ASSESSMENT AND PLAN / ED COURSE  Pertinent labs & imaging results that were available during my care of the patient were reviewed by me and considered in my medical decision making (see chart for details).    patient well appearing no acute distress,presents with constellation of symptoms most consistent with a viral bronchitis. Denies pain or nausea present time. He felt like he wouldn't be able to aggressively drink fluids, so he was given IV  fluids for hydration. This improved his heart rate from 120 initially down to 70. Vital signs are normalized. Labs are unremarkable, chest x-ray is unremarkable. EKG overall is unremarkable but does show some evidence of biatrial enlargement, so he'll be referred to cardiology follow-up. Suitable for discharge home at this time. Antibiotics do not appear to be indicated.      ____________________________________________   FINAL CLINICAL IMPRESSION(S) / ED DIAGNOSES    Final diagnoses:  Viral respiratory illness  Dehydration     ED Discharge Orders        Ordered    ondansetron (ZOFRAN ODT) 4 MG disintegrating tablet  Every 8 hours PRN     05/15/18 2318      Portions of this note were generated with dragon dictation software. Dictation errors may occur despite best attempts at proofreading.    Sharman Cheek, MD 05/15/18 2322

## 2018-05-16 NOTE — ED Notes (Signed)
Reviewed discharge instructions, follow-up care, and prescriptions with patient. Patient verbalized understanding of all information reviewed. Patient stable, with no distress noted at this time.    

## 2018-07-15 NOTE — Progress Notes (Signed)
Cardiology Office Note  Date:  07/16/2018   ID:  [name], DOB [birth year], MRN [id number]  PCP:  Jeff [name], No Pcp Per   Chief Complaint  Jeff [name] presents with  . other    Follow up from [facility] ER; sinus tachycardia. Pt. c/o rapid heart beats with occas. sharp pains in chest.     HPI:  Jeff [name] is a 22 year old gentleman with past medical history of Tachycardia Marijuana use Presenting for evaluation of chest pain, previously seen in the emergency room  Seen in the emergency room 05/15/2018 Central chest pain worse with coughing Some shortness of breath, green sputum Was given IV fluids with improved heart rate from 120 down to 70 Was referred to cardiology for biatrial enlargement on EKG  Seen in emergency room4/30/2019 for tachycardia chest pain Reported having pain in his teeth started on amoxicillin Took a THC a double Developed palpitations heart racing chest tightness Does use marijuana but never ingested it  In the emergency room May 2018 for chest pain Some shortness of breath Daily cigarette use Pressure in his chest Felt to have costochondritis told to take NSAIDs Told to quit smoking  On today's visit he reports periods of tachycardia Seems to come and go, not always with a stressor Often in the nighttime feels it might be fast Denies any significant chest pain on exertion  try to cut back on his marijuana use Try to quit smoking  EKG personally reviewed by myself on todays visit Shows normal sinus rhythm rate 71 bpm no significant ST or T-wave changes   PMH:   History of marijuana Smoker Tachycardia  PSH:   History reviewed. No pertinent surgical history.  Current Outpatient Medications  Medication Sig Dispense Refill  . propranolol (INDERAL) 20 MG tablet Take 1 tablet (20 mg total) by mouth 3 (three) times daily as needed. 90 tablet 1   No current facility-administered medications for this visit.      Allergies:   Jeff [name] has no  known allergies.   Social History:  The Jeff [name]  reports that he has been smoking cigarettes.  He has been smoking about 0.50 packs per day. He has never used smokeless tobacco. He reports that he drinks alcohol. He reports that he has current or past drug history. Drug: Marijuana.   Family History:   family history includes Heart attack in his paternal uncle; Hypertension in his mother.    Review of Systems: Review of Systems  Constitutional: Negative.   Respiratory: Negative.   Cardiovascular: Positive for palpitations.       Tachycardia  Gastrointestinal: Negative.   Musculoskeletal: Negative.   Neurological: Negative.   Psychiatric/Behavioral: Negative.   All other systems reviewed and are negative.    PHYSICAL EXAM: VS:  BP 128/80 (BP Location: Right Arm, Jeff [name] Position: Sitting, Cuff Size: Normal)   Pulse 71   Ht 6' (1.829 m)   Wt 143 lb 8 oz (65.1 kg)   BMI 19.46 kg/m  , BMI Body mass index is 19.46 kg/m. GEN: Thin, in no acute distress  HEENT: normal  Neck: no JVD, carotid bruits, or masses Cardiac: RRR; no murmurs, rubs, or gallops,no edema  Respiratory:  clear to auscultation bilaterally, normal work of breathing GI: soft, nontender, nondistended, + BS MS: no deformity or atrophy  Skin: warm and dry, no rash Neuro:  Strength and sensation are intact Psych: euthymic mood, full affect  Recent Labs: 05/15/2018: BUN 9; Creatinine, Ser 0.92; Hemoglobin 13.8; Platelets 203; Potassium 3.4;  Sodium 136    Lipid Panel No results found for: CHOL, HDL, LDLCALC, TRIG    Wt Readings from Last 3 Encounters:  07/16/18 143 lb 8 oz (65.1 kg)  05/15/18 145 lb (65.8 kg)  04/22/18 145 lb (65.8 kg)       ASSESSMENT AND PLAN:  Paroxysmal tachycardia (HCC) - Plan: EKG 12-Lead Suspect secondary to anxiety,or substance abuse Previously in the setting of ingesting marijuana Other episode in the setting of dental pain or antibiotics He does report tachycardia periodically  at home, recommended he stay hydrated If symptoms do not improve could take propranolol 10 mg up to 20 mg as needed Does not need medications on a regular basis and no further workup needed given normal EKG and normal clinical exam Recommended smoking cessation  Marijuana abuse Smoke cigarettes and marijuana Recommended cessation  Disposition:   F/U  As needed   Total encounter time more than 45 minutes  Greater than 50% was spent in counseling and coordination of care with the Jeff [name]    Orders Placed This Encounter  Procedures  . EKG 12-Lead     Signed, Dossie Arbourim Quiara Killian, M.D., Ph.D. 07/16/2018  [facility], [place] [phone]

## 2018-07-16 ENCOUNTER — Encounter

## 2018-07-16 ENCOUNTER — Ambulatory Visit: Payer: Self-pay | Admitting: Cardiovascular Disease

## 2018-07-16 ENCOUNTER — Encounter: Payer: Self-pay | Admitting: Cardiovascular Disease

## 2018-07-16 VITALS — BP 128/80 | HR 71 | Ht 72.0 in | Wt 143.5 lb

## 2018-07-16 DIAGNOSIS — F1210 Cannabis abuse, uncomplicated: Secondary | ICD-10-CM | POA: Insufficient documentation

## 2018-07-16 DIAGNOSIS — F17200 Nicotine dependence, unspecified, uncomplicated: Secondary | ICD-10-CM | POA: Insufficient documentation

## 2018-07-16 DIAGNOSIS — I479 Paroxysmal tachycardia, unspecified: Secondary | ICD-10-CM | POA: Insufficient documentation

## 2018-07-16 MED ORDER — PROPRANOLOL HCL 20 MG PO TABS: 20.0000 mg | ORAL_TABLET | Freq: Three times a day (TID) | ORAL | 1 refills | Status: DC | PRN

## 2018-07-16 NOTE — Patient Instructions (Addendum)
Medication Instructions:   Try propranolol as needed  20 mg pill , ok to break in 1/2 if needed No more than 1 pill three times a day Watch out for slow heart rate   Measure heart rate using phone app: Instant heart rate  Labwork:  No new labs needed  Testing/Procedures:  No further testing at this time   Follow-Up: It was a pleasure seeing you in the office today. Please call us if you have new issues that need to be addressed before your next appt.  [phone]  Your physician wants you to follow-up in: As needed  If you need a refill on your cardiac medications before your next appointment, please call your pharmacy.  For educational health videos Log in to : [email or web address] Or : [email or web address], password : [email or web address]

## 2018-11-07 ENCOUNTER — Emergency Department: Payer: 59

## 2018-11-07 ENCOUNTER — Emergency Department
Admission: EM | Admit: 2018-11-07 | Discharge: 2018-11-07 | Disposition: A | Discharge status: Home / Self Care | Payer: 59 | Attending: Emergency Medicine | Admitting: Emergency Medicine

## 2018-11-07 ENCOUNTER — Encounter: Payer: Self-pay | Admitting: Intensive Care

## 2018-11-07 ENCOUNTER — Other Ambulatory Visit: Payer: Self-pay

## 2018-11-07 DIAGNOSIS — Z79899 Other long term (current) drug therapy: Secondary | ICD-10-CM | POA: Diagnosis not present

## 2018-11-07 DIAGNOSIS — F17210 Nicotine dependence, cigarettes, uncomplicated: Secondary | ICD-10-CM | POA: Diagnosis not present

## 2018-11-07 DIAGNOSIS — I479 Paroxysmal tachycardia, unspecified: Secondary | ICD-10-CM | POA: Insufficient documentation

## 2018-11-07 DIAGNOSIS — R079 Chest pain, unspecified: Secondary | ICD-10-CM | POA: Diagnosis not present

## 2018-11-07 LAB — CBC
HEMATOCRIT: 42.6 % (ref 39.0–52.0)
Hemoglobin: 14.5 g/dL (ref 13.0–17.0)
MCH: 30.2 pg (ref 26.0–34.0)
MCHC: 34 g/dL (ref 30.0–36.0)
MCV: 88.8 fL (ref 80.0–100.0)
NRBC: 0 % (ref 0.0–0.2)
PLATELETS: 243 10*3/uL (ref 150–400)
RBC: 4.8 MIL/uL (ref 4.22–5.81)
RDW: 12.6 % (ref 11.5–15.5)
WBC: 7.1 10*3/uL (ref 4.0–10.5)

## 2018-11-07 LAB — TROPONIN I: Troponin I: 0.03 ng/mL (ref <0.03)

## 2018-11-07 LAB — BASIC METABOLIC PANEL
Anion gap: 7 (ref 5–15)
BUN: 9 mg/dL (ref 6–20)
CHLORIDE: 107 mmol/L (ref 98–111)
CO2: 29 mmol/L (ref 22–32)
CREATININE: 0.74 mg/dL (ref 0.61–1.24)
Calcium: 9.4 mg/dL (ref 8.9–10.3)
GFR calc Af Amer: >60 mL/min (ref >60)
GFR calc non Af Amer: >60 mL/min (ref >60)
Glucose, Bld: 109 mg/dL — ABNORMAL HIGH (ref 70–99)
POTASSIUM: 3.7 mmol/L (ref 3.5–5.1)
Sodium: 143 mmol/L (ref 135–145)

## 2018-11-07 NOTE — Discharge Instructions (Addendum)
You were evaluated for chest pain, and although no certain cause was found, your exam and evaluation are reassuring in the ER today.  As we discussed, you might consider reevaluation with cardiologist for Echo of the heart and/or Holter monitor.  Please follow up with a primary care doctor to discuss additional strategies for quitting smoking.  You may start with over the counter nicotine replacement.  Return to the ER for any worsening condition including, fever, weakness, numbness, dizziness or passing out, new or worsening or uncontrolled chest pain, any trouble breathing or pain with breathing, shortness of breath, wheezing, or any other symptoms concerning to you.

## 2018-11-07 NOTE — ED Triage Notes (Signed)
Patient c/o central chest pain that radiates to L arm present when he awoke this AM. Also reports feeling his heart racing and sometimes it beats so hard it feels like a "painful thump" Reports HX a same problem a couple months ago and hasnt really gone away

## 2018-11-07 NOTE — ED Provider Notes (Signed)
[facility] Emergency Department Provider Note ____________________________________________   I have reviewed the triage vital signs and the triage nursing note.  HISTORY  Chief Complaint Chest [name]   Historian Patient  HPI Cathie Hoopshomas E Ruperto is a 22 y.o. male presents with chest [name], Jeff [name], Jeff much better almost alleviated at this point.  No shortness of breath or wheezing or trouble breathing.   No fever.  Denies abdominal [name].  Reports that he still smoking cigarettes.  Denies any marijuana use recently, states he had quit after having a chest [name] episode and being evaluated in the ER and with cardiology over the last couple months.  Patient states that he had discussed using Holter monitor with the cardiologist, but held off at that point in time.  He reports that he is under quite a bit of stress.  He does feel safe at home.  He lives with his mom.  He works at [employer] and that is quite stressful.  No depression or suicidal ideation.    History reviewed. No pertinent past medical history.  Patient Active Problem List   Diagnosis Date Noted  . Paroxysmal tachycardia (HCC) 07/16/2018  . Marijuana abuse 07/16/2018  . Smoker 07/16/2018    History reviewed. No pertinent surgical history.  Prior to Admission medications   Medication Sig Start Date End Date Taking? Authorizing Provider  propranolol (INDERAL) 20 MG tablet Take 1 tablet (20 mg total) by mouth 3 (three) times daily as needed. 07/16/18  Yes Antonieta IbaGollan, Timothy J, MD    No Known Allergies  Family History  Problem Relation Age of Onset  . Hypertension Mother   . Heart attack Paternal Uncle     Social History Social History   Tobacco Use  . Smoking status: Current Every Day Smoker    Packs/day: 0.50    Types: Cigarettes  . Smokeless tobacco: Never Used  Substance Use Topics  . Alcohol use: Yes     Comment: socially 2xmonth  . Drug use: Yes    Types: Marijuana    Comment: daily     Review of Systems  Constitutional: Negative for fever. Eyes: Negative for visual changes. ENT: Negative for sore throat. Cardiovascular: Positive as per HPI for chest [name]. Respiratory: Negative for shortness of breath. Gastrointestinal: Negative for abdominal [name], vomiting and diarrhea. Genitourinary: Negative for dysuria. Musculoskeletal: Negative for back [name]. Skin: Negative for rash. Neurological: Negative for headache.  ____________________________________________   PHYSICAL EXAM:  VITAL SIGNS: ED Triage Vitals [11/07/18 1010]  Enc Vitals Group     BP 134/89     Pulse Rate (!) 108     Resp 20     Temp 98.2 F (36.8 C)     Temp Source Oral     SpO2 100 %     Weight 140 lb (63.5 kg)     Height 5\' 11"  (1.803 m)     Head Circumference      Peak Flow      [name] Score 9     [name] Loc      [name] Edu?      Excl. in GC?      Constitutional: Alert and oriented.  Little bit anxious. HEENT      Head: Normocephalic and atraumatic.      Eyes: Conjunctivae are normal. Pupils equal and round.       Ears:  Nose: No congestion/rhinnorhea.      Mouth/Throat: Mucous membranes are moist.      Neck: No stridor. Cardiovascular/Chest: Normal rate, regular rhythm.  No murmurs, rubs, or gallops. Respiratory: Normal respiratory effort without tachypnea nor retractions. Breath sounds are clear and equal bilaterally. No wheezes/rales/rhonchi. Gastrointestinal: Soft. No distention, no guarding, no rebound. Nontender.    Genitourinary/rectal:Deferred Musculoskeletal: Nontender with normal range of motion in all extremities. No joint effusions.  No lower extremity tenderness.  No edema. Neurologic:  Normal speech and language. No gross or focal neurologic deficits are appreciated. Skin:  Skin is warm, dry and intact. No rash noted. Psychiatric: Mood and affect are normal. Speech and behavior  are normal. Patient exhibits appropriate insight and judgment.   ____________________________________________  LABS (pertinent positives/negatives) I, Governor Rooks, MD the attending physician have reviewed the labs noted below.  Labs Reviewed  BASIC METABOLIC PANEL - Abnormal; Notable for the following components:      Result Value   Glucose, Bld 109 (*)    All other components within normal limits  TROPONIN I - Abnormal; Notable for the following components:   Troponin I 0.03 (*)    All other components within normal limits  CBC    ____________________________________________    EKG I, Governor Rooks, MD, the attending physician have personally viewed and interpreted all ECGs.  First EKG 93 bpm.  Normal sinus rhythm with sinus arrhythmia.  Narrow QS per normal axis.  Normal ST and T wave.  Left atrial enlargement.  No evidence of Brugada will Parkins white.  QTc 392  EKG number 2  98 bpm normal sinus rhythm.  Narrow QS per normal axis.  Left atrial enlargement.  Normal ST and T wave. ____________________________________________  RADIOLOGY   Chest x-ray two-view: There is no active cardia pulmonary disease. __________________________________________  PROCEDURES  Procedure(s) performed: None  Procedures  Critical Care performed: None   ____________________________________________  ED COURSE / ASSESSMENT AND PLAN  Pertinent labs & imaging results that were available during my care of the patient were reviewed by me and considered in my medical decision making (see chart for details).     Patient is overall well-appearing.  He is little bit anxious.  Heart rate in the 90s.  Initial triage heart rate 108.  Patient has been evaluated in the ER as well as at the cardiologist office and I was able to review those records.  1 of his episodes was felt to be due to bronchitis.  Additional episodes felt to be due to possible marijuana use.  He does report he is quit  smoking marijuana.  He is still smoking and nicotine we discussed as a cardiac stimulant and to try to decrease cardiac stimulants including nicotine and caffeine.  EKG is overall reassuring.  Laboratory studies are reassuring with normal electrolytes.  No anemia.  We discussed potentially sending him back to the cardiologist to consider echo and Holter monitor.  I do think that anxiety and stress are a significant contributor and we discussed strategies to help with that.  He requested recommendations on how to quit smoking and we discussed smoking cessation.  Symptoms do not seem consistent with pulmonary embolism.  No pleuritic component, no breathing symptoms at all.  I have referred him both for primary care follow-up as well as cardiology follow-up.    CONSULTATIONS:   None   Patient / Family / Caregiver informed of clinical course, medical decision-making process, and agree with plan.   I discussed return  precautions, follow-up instructions, and discharge instructions with patient and/or family.  Discharge Instructions : You were evaluated for chest [name], and although no certain cause was found, your exam and evaluation are reassuring in the ER [name].  As we discussed, you might consider reevaluation with cardiologist for Echo of the heart and/or Holter monitor.  Please follow up with a primary care doctor to discuss additional strategies for quitting smoking.  You may start with over the counter nicotine replacement.  Return to the ER for any worsening condition including, fever, weakness, numbness, dizziness or passing out, new or worsening or uncontrolled chest [name], any trouble breathing or [name] with breathing, shortness of breath, wheezing, or any other symptoms concerning to you.    ___________________________________________   FINAL CLINICAL IMPRESSION(S) / ED DIAGNOSES   Final diagnoses:  Nonspecific chest [name]       ___________________________________________         Note: This dictation was prepared with Dragon dictation. Any transcriptional errors that result from this process are unintentional    Governor Rooks, MD 11/07/18 1202

## 2018-11-10 ENCOUNTER — Telehealth: Payer: Self-pay | Admitting: Cardiovascular Disease

## 2018-11-10 NOTE — Telephone Encounter (Signed)
Lm with patient mother to have patient call and schedule appointment °Patient was seen in ED on 11/07/18  °  °Will await his call back °

## 2018-11-12 NOTE — Telephone Encounter (Signed)
Lm with patient mother to have patient call and schedule appointment Patient was seen in ED on 11/07/18   Will await his call back

## 2018-11-19 NOTE — Telephone Encounter (Signed)
Lm with patient mother to have patient call and schedule appointment °Patient was seen in ED on 11/07/18  °  °Will await his call back °

## 2018-11-24 ENCOUNTER — Encounter: Payer: Self-pay | Admitting: Cardiovascular Disease

## 2018-11-24 NOTE — Telephone Encounter (Signed)
Sent letter

## 2019-01-19 ENCOUNTER — Telehealth: Payer: Self-pay | Admitting: Cardiovascular Disease

## 2019-01-19 DIAGNOSIS — I479 Paroxysmal tachycardia, unspecified: Secondary | ICD-10-CM

## 2019-01-19 NOTE — Telephone Encounter (Signed)
STAT if HR is under 50 or over 120 (normal HR is 60-100 beats per minute)  1) What is your heart rate? Over the weekend, 176 on Saturday, jumps from 80-90 to 120, currently at 82  2) Do you have a log of your heart rate readings (document readings)? Saturday 176, today 82  3) Do you have any other symptoms? States sometimes his heart beats "hurt". States he cannot play outside with his dog without his heart pounding.  Please call after 11 am.

## 2019-01-19 NOTE — Telephone Encounter (Signed)
Pt with hx of paroxysmal tachycardia (last seen by Dr. Mariah Milling 07/16/18) with c/o increased episodes of fast heart rate and palpitations with "chest heaviness" over the last year. He believes that in the past month episodes have "really picked up".   Pt has apple watch and recorded HR 176 on saturday. He denies SOB or sweating during episodes. He denies chest pain at this time and HR in mid 80's. Episodes generally last for a few hours and are worse with activity including working at [employer] and running outside with his dog. Last recorded sx were this morning at work. He does not have any current blood pressures readings.   He has rx for Propranolol PRN but does not use it d/t it causing dizziness.  He cannot take it at night because he reports insomnia from medication. He last took it a week ago.     Pt was seen in ED on 11/07/18 for chest pain that was assoc with same complaint. He felt "they didn't really do anything for me". At his last appointment Dr. Mariah Milling suggested more testing but pt was unable at that time d/t being uninsured. Pt reports that he now has health insurance and would like to proceed with POC.   Appointment made for 01/29/19 with Eula Listen, PA.   Message routed to Dr. Mariah Milling, DOD for further advice as well as Eula Listen, PA.

## 2019-01-22 NOTE — Telephone Encounter (Signed)
If pt can now afford, reasonable to place zio w/ live monitoring for palpitations/tachycardia, so that we'll have some data when he sees [place] on 2/6.

## 2019-01-22 NOTE — Telephone Encounter (Signed)
Message received from Ward Givens, NP who is covering Eula Listen, [place].  Call to pt to confirm that he would like to proceed with zio AT order.   Pt agreeable to wear zio. Order placed and pt registered with website. I confirmed demographics.   He reports that his chest pain has improved, but is still having frequent episodes of palpitations and fast heart rate. He has been able to work this week and continued normal daily activities. Last PRN Propranolol dose taken on Tuesday x 1 which he reports tolerating well.   Pt confirmed appt for next week.  Advised pt to call for any further questions or concerns

## 2019-01-24 ENCOUNTER — Ambulatory Visit (INDEPENDENT_AMBULATORY_CARE_PROVIDER_SITE_OTHER): Payer: 59

## 2019-01-24 DIAGNOSIS — I479 Paroxysmal tachycardia, unspecified: Secondary | ICD-10-CM | POA: Diagnosis not present

## 2019-01-26 NOTE — Telephone Encounter (Signed)
Incoming call from pt to verify zio is functioning. I told patient that from the zio website, everything appears to be working. I can see that he placed it on sat afternoon and pushed the button on Sunday morning during an episode of tachycardia of 137. He reports that he is keeping track of sx in the diary.   Pt verbalized understanding and all questions were answered at this time.  Advised pt to call for any further questions or concerns.

## 2019-01-26 NOTE — Telephone Encounter (Signed)
Patient calling to notify us he placed monitor on Saturday and wants to verify it is actually working

## 2019-01-28 NOTE — Telephone Encounter (Signed)
I do not see where I have ever seen this patient or if there is any need from a provider on this note.

## 2019-01-29 ENCOUNTER — Ambulatory Visit: Payer: 59 | Admitting: Physician Assistant

## 2019-02-04 ENCOUNTER — Encounter: Payer: Self-pay | Admitting: Nurse Practitioner

## 2019-02-04 ENCOUNTER — Ambulatory Visit: Payer: 59 | Admitting: Nurse Practitioner

## 2019-02-04 VITALS — BP 126/70 | HR 77 | Ht 72.0 in | Wt 150.5 lb

## 2019-02-04 DIAGNOSIS — I479 Paroxysmal tachycardia, unspecified: Secondary | ICD-10-CM

## 2019-02-04 DIAGNOSIS — R000 Tachycardia, unspecified: Secondary | ICD-10-CM

## 2019-02-04 DIAGNOSIS — R002 Palpitations: Secondary | ICD-10-CM | POA: Diagnosis not present

## 2019-02-04 NOTE — Progress Notes (Signed)
Office Visit    Patient Name: Jeff [name] Date of Encounter: 02/04/2019  Primary Care Provider:  Patient, No Pcp Per Primary Cardiologist:  Julien Nordmann, MD  Chief Complaint    23 y/o ? with a h/o palpitations/tachycardia, tobacco and marijuana use, and chest pain, who presents for follow-up related to palpitations.  Past Medical History    Past Medical History:  Diagnosis Date  . Marijuana abuse   . Palpitations   . Tobacco abuse    History reviewed. No pertinent surgical history.  Allergies  No Known Allergies  History of Present Illness    23 year old male with the above past medical history including palpitations, tachycardia, and chest pain for which he has previously been evaluated in the emergency department dating back to May 2018.  Most recently, in May 2019, he was seen for dyspnea and pleuritic chest pain.  In that setting, heart rate was 120 improved to 70 following IV fluids.  He last saw Dr. Mariah Milling in July, at which time he was advised to improve hydration.  On January 27, he contacted our office reporting that he had elevated heart rates noted on his apple watch, to a rate of 176.  With this, he had tachypalpitations and mild chest tightness.  A ZIO monitor was ordered and placed.  Since wearing the 0 monitor, he is continued to have frequent episodes of tachycardia, most likely occurring when he is either lightly jogging, rushing, or taking out garbage at work.  When his heart rate comes up, he says it feels like his chest is pounding.  He has triggered these events on the ZIO monitor and each time, they have showed sinus tachycardia without any ectopic beats or rhythm.  He denies presyncope, syncope, edema, or early satiety.  He continues to smoke about 7 or 8 cigarettes a day and 1 marijuana cigarette every night.  He rarely drinks alcohol.  He does have propranolol to be used at home as needed but says he rarely takes it as he is concerned that it might cause  him to feel dizzy.  Home Medications    Prior to Admission medications   Medication Sig Start Date End Date Taking? Authorizing Provider  propranolol (INDERAL) 20 MG tablet Take 1 tablet (20 mg total) by mouth 3 (three) times daily as needed. 07/16/18  Yes [name], MD    Review of Systems    Ongoing tachypalpitations as outlined above.  He occasionally notes mild chest tightness with palpitations which can last up to an hour.  He denies dyspnea, PND, orthopnea, dizziness, syncope, edema, or early satiety.  All other systems reviewed and are otherwise negative except as noted above.  Physical Exam    VS:  BP 126/70 (BP Location: Left Arm, Patient Position: Sitting, Cuff Size: Normal)   Pulse 77   Ht 6' (1.829 m)   Wt 150 lb 8 oz (68.3 kg)   BMI 20.41 kg/m  , BMI Body mass index is 20.41 kg/m. GEN: Well nourished, well developed, in no acute distress. HEENT: normal. Neck: Supple, no JVD, carotid bruits, or masses. Cardiac: RRR, no murmurs, rubs, or gallops. No clubbing, cyanosis, edema.  Radials/DP/PT 2+ and equal bilaterally.  Respiratory:  Respirations regular and unlabored, clear to auscultation bilaterally. GI: Soft, nontender, nondistended, BS + x 4. MS: no deformity or atrophy. Skin: warm and dry, no rash. Neuro:  Strength and sensation are intact. Psych: Normal affect.  Accessory Clinical Findings    ECG  personally reviewed by me today -sinus arrhythmia, 77- no acute changes.  Assessment & Plan    1.  Tachypalpitations/sinus tachycardia: Patient recently had a ZIO monitor placed and is still wearing this.  He is due to take it off on Saturday.  He was ordered this monitor after calling with a report of heart rates into the 170s.  He notes palpitations are most likely to occur in the setting of activity.  Triggered events on the monitor up to this point shows sinus tachycardia without any ectopy.  Reviewed his lab work dating back to last year.  I do not see that  he has ever had a TSH checked and I will obtain that today.  I have encouraged him to adequately hydrate and also avoid caffeine, tobacco, marijuana, and alcohol.  2.  Tobacco abuse/marijuana abuse: Complete cessation advised.  3.  Disposition: Follow-up with Dr. Mariah Milling in 2 to 3 months.  Awaiting final 0 monitor report once he removes it and mailed it back later this week.   Nicolasa Ducking, NP 02/04/2019, 4:21 PM

## 2019-02-04 NOTE — Patient Instructions (Signed)
Medication Instructions:  Your physician recommends that you continue on your current medications as directed. Please refer to the Current Medication list given to you today.  If you need a refill on your cardiac medications before your next appointment, please call your pharmacy.   Lab work: Your physician recommends that you return for lab work today (TSH)  If you have labs (blood work) drawn today and your tests are completely normal, you will receive your results only by: Marland Kitchen MyChart Message (if you have MyChart) OR . A paper copy in the mail If you have any lab test that is abnormal or we need to change your treatment, we will call you to review the results.  Testing/Procedures: None ordered   Follow-Up: At [facility], you and your health needs are our priority.  As part of our continuing mission to provide you with exceptional heart care, we have created designated Provider Care Teams.  These Care Teams include your primary Cardiologist (physician) and Advanced Practice Providers (APPs -  Physician Assistants and Nurse Practitioners) who all work together to provide you with the care you need, when you need it. You will need a follow up appointment in 2 months. Please see Julien Nordmann, MD.

## 2019-02-05 ENCOUNTER — Telehealth: Payer: Self-pay

## 2019-02-05 LAB — TSH: TSH: 1.87 u[IU]/mL (ref 0.450–4.500)

## 2019-02-05 NOTE — Telephone Encounter (Signed)
-----   Message from Creig Hines, NP sent at 02/05/2019  8:07 AM EST ----- tsh wnl

## 2019-02-05 NOTE — Telephone Encounter (Signed)
Call to patient to review lab results. He verbalized understanding and had no further questions at this time.   Advised pt to call for any further questions or concerns. 

## 2019-02-09 ENCOUNTER — Ambulatory Visit: Payer: 59 | Admitting: Physician Assistant

## 2019-03-23 ENCOUNTER — Telehealth: Payer: Self-pay

## 2019-03-23 NOTE — Telephone Encounter (Signed)
Left voicemail message requesting to have patient call back to discuss changing 04/07/2019 appointment to an evisit.

## 2019-03-26 ENCOUNTER — Telehealth: Payer: Self-pay

## 2019-03-26 NOTE — Telephone Encounter (Signed)
Virtual Visit Pre-Appointment Phone Call  Steps For Call:  1. Confirm consent - "In the setting of the current Covid19 crisis, you are scheduled for a VIDEO visit with your provider on 04/07/2019 at 2:00PM.  Just as we do with many in-office visits, in order for you to participate in this visit, we must obtain consent.  If you'd like, I can send this to your mychart (if signed up) or email for you to review.  Otherwise, I can obtain your verbal consent now.  All virtual visits are billed to your insurance company just like a normal visit would be.  By agreeing to a virtual visit, we'd like you to understand that the technology does not allow for your provider to perform an examination, and thus may limit your provider's ability to fully assess your condition.  Finally, though the technology is pretty good, we cannot assure that it will always work on either your or our end, and in the setting of a video visit, we may have to convert it to a phone-only visit.  In either situation, we cannot ensure that we have a secure connection.  Are you willing to proceed?"  2. Give patient instructions for WebEx download to smartphone as below if video visit  3. Advise patient to be prepared with any vital sign or heart rhythm information, their current medicines, and a piece of paper and pen handy for any instructions they may receive the day of their visit  4. Inform patient they will receive a phone call 15 minutes prior to their appointment time (may be from unknown caller ID) so they should be prepared to answer  5. Confirm that appointment type is correct in Epic appointment notes (video vs telephone)    TELEPHONE CALL NOTE  Jeff [name] has been deemed a candidate for a follow-up tele-health visit to limit community exposure during the Covid-19 pandemic. I spoke with the patient via phone to ensure availability of phone/video source, confirm preferred email & phone number, and discuss  instructions and expectations.  I reminded Jeff [name] to be prepared with any vital sign and/or heart rhythm information that could potentially be obtained via home monitoring, at the time of his visit. I reminded Jeff [name] to expect a phone call at the time of his visit if his visit.  Did the patient verbally acknowledge consent to treatment? YES  Margrett Rud, [place] 03/26/2019 12:09 PM   CONSENT FOR TELE-HEALTH VISIT - PLEASE REVIEW  I hereby voluntarily request, consent and authorize [facility] and its employed or contracted physicians, physician assistants, nurse practitioners or other licensed health care professionals (the Practitioner), to provide me with telemedicine health care services (the Services") as deemed necessary by the treating Practitioner. I acknowledge and consent to receive the Services by the Practitioner via telemedicine. I understand that the telemedicine visit will involve communicating with the Practitioner through live audiovisual communication technology and the disclosure of certain medical information by electronic transmission. I acknowledge that I have been given the opportunity to request an in-person assessment or other available alternative prior to the telemedicine visit and am voluntarily participating in the telemedicine visit.  I understand that I have the right to withhold or withdraw my consent to the use of telemedicine in the course of my care at any time, without affecting my right to future care or treatment, and that the Practitioner or I may terminate the telemedicine visit at any time. I understand that I  have the right to inspect all information obtained and/or recorded in the course of the telemedicine visit and may receive copies of available information for a reasonable fee.  I understand that some of the potential risks of receiving the Services via telemedicine include:   Delay or interruption in medical evaluation due to  technological equipment failure or disruption;  Information transmitted may not be sufficient (e.g. poor resolution of images) to allow for appropriate medical decision making by the Practitioner; and/or   In rare instances, security protocols could fail, causing a breach of personal health information.  Furthermore, I acknowledge that it is my responsibility to provide information about my medical history, conditions and care that is complete and accurate to the best of my ability. I acknowledge that Practitioner's advice, recommendations, and/or decision may be based on factors not within their control, such as incomplete or inaccurate data provided by me or distortions of diagnostic images or specimens that may result from electronic transmissions. I understand that the practice of medicine is not an exact science and that Practitioner makes no warranties or guarantees regarding treatment outcomes. I acknowledge that I will receive a copy of this consent concurrently upon execution via email to the email address I last provided but may also request a printed copy by calling the office of [place].    I understand that my insurance will be billed for this visit.   I have read or had this consent read to me.  I understand the contents of this consent, which adequately explains the benefits and risks of the Services being provided via telemedicine.   I have been provided ample opportunity to ask questions regarding this consent and the Services and have had my questions answered to my satisfaction.  I give my informed consent for the services to be provided through the use of telemedicine in my medical care  By participating in this telemedicine visit I agree to the above.

## 2019-04-06 NOTE — Progress Notes (Addendum)
Virtual Visit via Video Note   This visit type was conducted due to national recommendations for restrictions regarding the COVID-19 Pandemic (e.g. social distancing) in an effort to limit this patient's exposure and mitigate transmission in our community.  Due to his co-morbid illnesses, this patient is at least at moderate risk for complications without adequate follow up.  This format is felt to be most appropriate for this patient at this time.  All issues noted in this document were discussed and addressed.  A limited physical exam was performed with this format.  Please refer to the patient's chart for his consent to telehealth for [facility].   Date:  04/06/2019   ID:  Jeff [name], DOB Feb 18, 1996, MRN [id number]  Patient Location:  [address] [place] [address]   Provider location:   [facility], [place] office  PCP:  Patient, No Pcp Per  Cardiologist:  Fonnie Mu  Chief Complaint:  Tachycardia    History of Present Illness:    Jeff [name] is a 23 y.o. male who presents via audio/video conferencing for a telehealth visit today.   The patient does not symptoms concerning for COVID-19 infection (fever, chills, cough, or new SHORTNESS OF BREATH).   Patient has a past medical history of Tachycardia Marijuana use Presenting for evaluation of chest pain, previously seen in the emergency room   January 19 2019, contacted our office reporting that he had elevated heart rates noted on his apple watch, to a rate of 176.   mild chest tightness associated with the tachycardia   ZIO monitor  showed sinus tachycardia without any ectopic beats or rhythm.    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 now staying at home  Other past medical history reviewed  emergency room 05/15/2018 Central chest pain worse with coughing Some shortness of breath, green sputum Was given IV fluids with improved heart rate from 120 down to 70 Was referred to cardiology for biatrial enlargement on EKG   emergency room 04/22/2018 for tachycardia chest pain Reported having pain in his teeth started on amoxicillin Took a THC a double Developed palpitations heart racing chest tightness Does use marijuana but never ingested it  In the emergency room May 2018 for chest pain Some shortness of breath Daily cigarette use Pressure in his chest Felt to have costochondritis told to take NSAIDs Told to quit smoking   Prior CV studies:   The following studies were reviewed today:    Past Medical History:  Diagnosis Date  . Marijuana abuse   . Palpitations   . Tobacco abuse    No past surgical history on file.   No outpatient medications have been marked as taking for the 04/07/19 encounter (Appointment) with [name], MD.     Allergies:   Patient has no known allergies.   Social History   Tobacco Use  . Smoking status: Current Every Day Smoker    Packs/day: 0.50    Types: Cigarettes  . Smokeless tobacco: Never Used  Substance Use Topics  .  Alcohol use: Yes    Comment: socially 2xmonth  . Drug use: Yes    Types: Marijuana    Comment: daily      Current Outpatient Medications on File Prior to Visit  Medication Sig Dispense Refill  . propranolol (INDERAL) 20 MG tablet Take 1 tablet (20 mg total) by mouth 3 (three) times daily as needed. 90 tablet 1   No current facility-administered medications on file prior to visit.      Family Hx: The patient's family history includes Heart  attack in his paternal uncle; Hypertension in his mother.  ROS:   Please see the history of present illness.    Review of Systems  Constitutional: Negative.   Respiratory: Negative.   Cardiovascular: Negative.        Paroxysmal tachycardia  Gastrointestinal: Negative.   Musculoskeletal: Negative.   Neurological: Negative.   Psychiatric/Behavioral: Negative.   All other systems reviewed and are negative.    Labs/Other Tests and Data Reviewed:    Recent Labs: 11/07/2018: BUN 9; Creatinine, Ser 0.74; Hemoglobin 14.5; Platelets 243; Potassium 3.7; Sodium 143 02/04/2019: TSH 1.870   Recent Lipid Panel No results found for: CHOL, TRIG, HDL, CHOLHDL, LDLCALC, LDLDIRECT  Wt Readings from Last 3 Encounters:  02/04/19 150 lb 8 oz (68.3 kg)  11/07/18 140 lb (63.5 kg)  07/16/18 143 lb 8 oz (65.1 kg)     Exam:    Vital Signs: Vital signs may also be detailed in the HPI There were no vitals taken for this visit.  Wt Readings from Last 3 Encounters:  02/04/19 150 lb 8 oz (68.3 kg)  11/07/18 140 lb (63.5 kg)  07/16/18 143 lb 8 oz (65.1 kg)   Temp Readings from Last 3 Encounters:  11/07/18 98.2 F (36.8 C) (Oral)  05/16/18 98.2 F (36.8 C) (Oral)  04/22/18 98.5 F (36.9 C) (Oral)   BP Readings from Last 3 Encounters:  02/04/19 126/70  11/07/18 (!) 139/95  07/16/18 128/80   Pulse Readings from Last 3 Encounters:  02/04/19 77  11/07/18 92  07/16/18 71    Blood pressure 120/70, heart rate 90, respiration 16  Well nourished, well developed male in no acute distress. Constitutional:  oriented to person, place, and time. No distress.  Head: Normocephalic and atraumatic.  Eyes:  no discharge. No scleral icterus.  Neck: Normal range of motion. Neck supple.  Pulmonary/Chest: No audible wheezing, no distress, appears comfortable Musculoskeletal: Normal range of motion.  no  tenderness or deformity.  Neurological:   Coordination normal. Full exam not performed Skin:  No rash  Psychiatric:  normal mood and affect. behavior is normal. Thought content normal.    ASSESSMENT & PLAN:    Paroxysmal tachycardia (HCC) - Plan: EKG 12-Lead Baseline tachycardia seen on event monitor Results discussed with him in detail Appreciates the tachycardia more when he is running Still taking propranolol as needed He does appreciate tachycardia when laying down going to bed -He is interested in other treatment options for his tachycardia Suggested he try metoprolol succinate 25 mg daily Continue to take propranolol 3 times daily as needed for breakthrough tachycardia Recommended smoking cessation  Marijuana abuse Smoke cigarettes and marijuana Recommended cessation   COVID-19 Education: The signs and symptoms of COVID-19 were discussed with the patient and how to seek care for testing (follow up with PCP or arrange E-visit).  The importance of social distancing was discussed today.  Patient Risk:   After full review of this patients clinical status, I feel  that they are at least moderate risk at this time.  Time:   Today, I have spent 25 minutes with the patient with telehealth technology discussing the cardiac and medical problems/diagnoses detailed above     Medication Adjustments/Labs and Tests Ordered: Current medicines are reviewed at length with the patient today.  Concerns regarding medicines are outlined above.   Tests Ordered: No tests ordered   Medication Changes: Changes as detailed above We will add metoprolol   Disposition: Follow-up in 12 months   Signed, Julien Nordmannimothy Rayel Santizo, MD  04/06/2019 4:06 PM    [facility] [place] [facility] [address], [place], [place] [address]

## 2019-04-07 ENCOUNTER — Telehealth (INDEPENDENT_AMBULATORY_CARE_PROVIDER_SITE_OTHER): Payer: 59 | Admitting: Cardiovascular Disease

## 2019-04-07 ENCOUNTER — Other Ambulatory Visit: Payer: Self-pay

## 2019-04-07 DIAGNOSIS — F17200 Nicotine dependence, unspecified, uncomplicated: Secondary | ICD-10-CM

## 2019-04-07 DIAGNOSIS — F1210 Cannabis abuse, uncomplicated: Secondary | ICD-10-CM

## 2019-04-07 DIAGNOSIS — I479 Paroxysmal tachycardia, unspecified: Secondary | ICD-10-CM

## 2019-04-07 MED ORDER — METOPROLOL SUCCINATE ER 25 MG PO TB24: 25.0000 mg | ORAL_TABLET | Freq: Every day | ORAL | 6 refills | Status: DC

## 2019-04-07 NOTE — Patient Instructions (Addendum)
Medication Instructions:  Your physician has recommended you make the following change in your medication:  1. START Metoprolol Succinate 25 mg once daily 2. CONTINUE Propranolol 20 mg three times daily as needed for breakthrough tachycardia  Call us once you know if this metoprolol is working for symptoms  If you need a refill on your cardiac medications before your next appointment, please call your pharmacy.    Lab work: No new labs needed   If you have labs (blood work) drawn today and your tests are completely normal, you will receive your results only by: Marland Kitchen MyChart Message (if you have MyChart) OR . A paper copy in the mail If you have any lab test that is abnormal or we need to change your treatment, we will call you to review the results.   Testing/Procedures: No new testing needed   Follow-Up: At [facility], you and your health needs are our priority.  As part of our continuing mission to provide you with exceptional heart care, we have created designated Provider Care Teams.  These Care Teams include your primary Cardiologist (physician) and Advanced Practice Providers (APPs -  Physician Assistants and Nurse Practitioners) who all work together to provide you with the care you need, when you need it.  . You will need a follow up appointment in 12 months .   Please call our office 2 months in advance to schedule this appointment.    . Providers on your designated Care Team:   . Nicolasa Ducking, NP . Eula Listen, PA-C . Marisue Ivan, PA-C  Any Other Special Instructions Will Be Listed Below (If Applicable).  For educational health videos Log in to : [email or web address] Or : [email or web address], password : [email or web address]

## 2019-04-13 ENCOUNTER — Telehealth: Payer: Self-pay

## 2019-04-13 NOTE — Telephone Encounter (Signed)
Call to patient to review results of monitor. LMTCB.

## 2019-04-13 NOTE — Telephone Encounter (Signed)
-----   Message from Creig Hines, NP sent at 04/13/2019 10:41 AM EDT ----- Reassuring monitor study.  Most triggered events were sinus tachycardia, which is generally a normal rhythm depending on activity or emotional responses.  He did have 6 consecutive extra heart beats from the top chamber of the heart.  With such a short and isolated run, this is not significant.  No need for medical or more advanced therapies.

## 2019-04-13 NOTE — Telephone Encounter (Signed)
Incoming returned call from patient.   Reviewed results and he had no further questions at this time.   Advised pt to call for any further questions or concerns.

## 2019-07-20 ENCOUNTER — Other Ambulatory Visit: Payer: Self-pay | Admitting: *Deleted

## 2019-07-20 MED ORDER — PROPRANOLOL HCL 20 MG PO TABS: 20.0000 mg | ORAL_TABLET | Freq: Three times a day (TID) | ORAL | 3 refills | Status: DC | PRN

## 2019-09-23 ENCOUNTER — Other Ambulatory Visit: Payer: Self-pay | Admitting: Cardiovascular Disease

## 2020-05-17 ENCOUNTER — Emergency Department: Payer: Self-pay

## 2020-05-17 ENCOUNTER — Other Ambulatory Visit: Payer: Self-pay

## 2020-05-17 ENCOUNTER — Emergency Department
Admission: EM | Admit: 2020-05-17 | Discharge: 2020-05-17 | Disposition: A | Discharge status: Home / Self Care | Payer: Self-pay | Attending: Emergency Medicine | Admitting: Emergency Medicine

## 2020-05-17 DIAGNOSIS — Z87891 Personal history of nicotine dependence: Secondary | ICD-10-CM | POA: Insufficient documentation

## 2020-05-17 DIAGNOSIS — Z79899 Other long term (current) drug therapy: Secondary | ICD-10-CM | POA: Insufficient documentation

## 2020-05-17 DIAGNOSIS — N200 Calculus of kidney: Secondary | ICD-10-CM | POA: Insufficient documentation

## 2020-05-17 LAB — COMPREHENSIVE METABOLIC PANEL
ALT: 11 U/L (ref 0–44)
AST: 20 U/L (ref 15–41)
Albumin: 4.8 g/dL (ref 3.5–5.0)
Alkaline Phosphatase: 63 U/L (ref 38–126)
Anion gap: 8 (ref 5–15)
BUN: 18 mg/dL (ref 6–20)
CO2: 27 mmol/L (ref 22–32)
Calcium: 9.4 mg/dL (ref 8.9–10.3)
Chloride: 105 mmol/L (ref 98–111)
Creatinine, Ser: 1.06 mg/dL (ref 0.61–1.24)
GFR calc Af Amer: >60 mL/min (ref >60)
GFR calc non Af Amer: >60 mL/min (ref >60)
Glucose, Bld: 123 mg/dL — ABNORMAL HIGH (ref 70–99)
Potassium: 3.7 mmol/L (ref 3.5–5.1)
Sodium: 140 mmol/L (ref 135–145)
Total Bilirubin: 0.9 mg/dL (ref 0.3–1.2)
Total Protein: 7.9 g/dL (ref 6.5–8.1)

## 2020-05-17 LAB — CBC
HCT: 41.1 % (ref 39.0–52.0)
Hemoglobin: 13.9 g/dL (ref 13.0–17.0)
MCH: 29.8 pg (ref 26.0–34.0)
MCHC: 33.8 g/dL (ref 30.0–36.0)
MCV: 88.2 fL (ref 80.0–100.0)
Platelets: 282 10*3/uL (ref 150–400)
RBC: 4.66 MIL/uL (ref 4.22–5.81)
RDW: 12.2 % (ref 11.5–15.5)
WBC: 12.7 10*3/uL — ABNORMAL HIGH (ref 4.0–10.5)
nRBC: 0 % (ref 0.0–0.2)

## 2020-05-17 LAB — URINALYSIS, COMPLETE (UACMP) WITH MICROSCOPIC
Bacteria, UA: NONE SEEN
Bilirubin Urine: NEGATIVE
Glucose, UA: NEGATIVE mg/dL
Ketones, ur: NEGATIVE mg/dL
Leukocytes,Ua: NEGATIVE
Nitrite: NEGATIVE
Protein, ur: NEGATIVE mg/dL
Specific Gravity, Urine: 1.023 (ref 1.005–1.030)
Squamous Epithelial / HPF: NONE SEEN (ref 0–5)
WBC, UA: NONE SEEN WBC/hpf (ref 0–5)
pH: 6 (ref 5.0–8.0)

## 2020-05-17 LAB — LIPASE, BLOOD: Lipase: 21 U/L (ref 11–51)

## 2020-05-17 MED ORDER — SODIUM CHLORIDE 0.9% FLUSH: 3.0000 mL | Freq: Once | INTRAVENOUS | Status: DC

## 2020-05-17 MED ORDER — TAMSULOSIN HCL 0.4 MG PO CAPS: 0.4000 mg | ORAL_CAPSULE | Freq: Every day | ORAL | 0 refills | Status: AC

## 2020-05-17 MED ORDER — SODIUM CHLORIDE 0.9 % IV BOLUS
1000.0000 mL | Freq: Once | INTRAVENOUS | Status: AC
  Administered 2020-05-17: 1000 mL via INTRAVENOUS

## 2020-05-17 MED ORDER — TAMSULOSIN HCL 0.4 MG PO CAPS
0.4000 mg | ORAL_CAPSULE | Freq: Once | ORAL | Status: AC
  Administered 2020-05-17: 0.4 mg via ORAL
  Filled 2020-05-17: qty 1

## 2020-05-17 MED ORDER — OXYCODONE-ACETAMINOPHEN 5-325 MG PO TABS
1.0000 | ORAL_TABLET | Freq: Once | ORAL | Status: AC
  Administered 2020-05-17: 1 via ORAL
  Filled 2020-05-17: qty 1

## 2020-05-17 MED ORDER — ONDANSETRON 4 MG PO TBDP: 4.0000 mg | ORAL_TABLET | Freq: Three times a day (TID) | ORAL | 0 refills | Status: DC | PRN

## 2020-05-17 MED ORDER — HYDROCODONE-ACETAMINOPHEN 5-325 MG PO TABS: 1.0000 | ORAL_TABLET | Freq: Three times a day (TID) | ORAL | 0 refills | Status: AC | PRN

## 2020-05-17 MED ORDER — KETOROLAC TROMETHAMINE 30 MG/ML IJ SOLN
30.0000 mg | Freq: Once | INTRAMUSCULAR | Status: AC
  Administered 2020-05-17: 30 mg via INTRAVENOUS
  Filled 2020-05-17: qty 1

## 2020-05-17 MED ORDER — ONDANSETRON 4 MG PO TBDP
4.0000 mg | ORAL_TABLET | Freq: Once | ORAL | Status: AC
  Administered 2020-05-17: 4 mg via ORAL
  Filled 2020-05-17: qty 1

## 2020-05-17 MED ORDER — ONDANSETRON HCL 4 MG/2ML IJ SOLN: 4.0000 mg | Freq: Once | INTRAMUSCULAR | Status: DC

## 2020-05-17 MED ORDER — KETOROLAC TROMETHAMINE 10 MG PO TABS: 10.0000 mg | ORAL_TABLET | Freq: Three times a day (TID) | ORAL | 0 refills | Status: DC

## 2020-05-17 NOTE — ED Notes (Signed)
Pt at CT at this time.

## 2020-05-17 NOTE — Discharge Instructions (Signed)
You have a 3 mm stone between the left kidney and the bladder. You should pass this small stone without difficulty. You should increase your intake on non-carbonated drinks, and instead drink water, Powerade, Gatorade, green tea. Take the prescription meds as directed. Follow-up with Dr. Richardo Hanks as needed. Return to the ED for severe pain, fever, or bloody urine.

## 2020-05-17 NOTE — ED Provider Notes (Signed)
[facility] Emergency Department Provider Note ____________________________________________  Time seen: 1953  I have reviewed the triage vital signs and the nursing notes.  HISTORY  Chief Complaint  Abdominal Pain  HPI Jeff [name] is a 24 y.o. male presents with self to the ED about 30 minutes after sudden onset of left flank pain, several hours ago..  Patient describes a sharp persistent pain to the left leg referral into the left lower quadrant of the abdomen.  He reports  nausea and vomiting related to the pain.  Patient denies any history of kidney stones.  He denies any significant medical history and takes no daily medications.  Past Medical History:  Diagnosis Date  . Marijuana abuse   . Palpitations   . Tobacco abuse     Patient Active Problem List   Diagnosis Date Noted  . Paroxysmal tachycardia (HCC) 07/16/2018  . Marijuana abuse 07/16/2018  . Smoker 07/16/2018    History reviewed. No pertinent surgical history.  Prior to Admission medications   Medication Sig Start Date End Date Taking? Authorizing Provider  HYDROcodone-acetaminophen (NORCO) 5-325 MG tablet Take 1 tablet by mouth 3 (three) times daily as needed for up to 3 days. 05/17/20 05/20/20  Sybella Harnish, Charlesetta Ivory, PA-C  ketorolac (TORADOL) 10 MG tablet Take 1 tablet (10 mg total) by mouth every 8 (eight) hours. 05/17/20   Whitleigh Garramone, Charlesetta Ivory, PA-C  metoprolol succinate (TOPROL-XL) 25 MG 24 hr tablet TAKE 1 TABLET BY MOUTH EVERY DAY 09/23/19   Gollan, Tollie Pizza, MD  ondansetron (ZOFRAN ODT) 4 MG disintegrating tablet Take 1 tablet (4 mg total) by mouth every 8 (eight) hours as needed. 05/17/20   Torez Beauregard, Charlesetta Ivory, PA-C  propranolol (INDERAL) 20 MG tablet Take 1 tablet (20 mg total) by mouth 3 (three) times daily as needed. 07/20/19   [name], MD  tamsulosin (FLOMAX) 0.4 MG CAPS capsule Take 1 capsule (0.4 mg total) by mouth daily for 10 days. 05/17/20 05/27/20  Andrey Hoobler,  Charlesetta Ivory, PA-C    Allergies Patient has no known allergies.  Family History  Problem Relation Age of Onset  . Hypertension Mother   . Heart attack Paternal Uncle     Social History Social History   Tobacco Use  . Smoking status: Former Smoker    Packs/day: 0.50    Types: Cigarettes  . Smokeless tobacco: Never Used  Substance Use Topics  . Alcohol use: Yes    Comment: socially 2xmonth  . Drug use: Yes    Types: Marijuana    Comment: daily     Review of Systems  Constitutional: Negative for fever. Cardiovascular: Negative for chest pain. Respiratory: Negative for shortness of breath. Gastrointestinal: Positive for left flank/abdominal pain, vomiting.  Denies constipation and diarrhea. Genitourinary: Negative for dysuria, hematuria, or retention. Musculoskeletal: Negative for back pain. Skin: Negative for rash. Neurological: Negative for headaches, focal weakness or numbness. ____________________________________________  PHYSICAL EXAM:  VITAL SIGNS: ED Triage Vitals [05/17/20 1733]  Enc Vitals Group     BP      Pulse      Resp      Temp      Temp src      SpO2      Weight 140 lb (63.5 kg)     Height 6' (1.829 m)     Head Circumference      Peak Flow      Pain Score 10     Pain Loc  Pain Edu?      Excl. in [place]?     Constitutional: Alert and oriented. Well appearing and in no distress. Head: Normocephalic and atraumatic. Eyes: Conjunctivae are normal. Normal extraocular movements Cardiovascular: Normal rate, regular rhythm. Normal distal pulses. Respiratory: Normal respiratory effort. No wheezes/rales/rhonchi. Gastrointestinal: Soft and nontender. No distention, rebound, guarding, or rigidity.  Left-sided flank pain on exam.  Positive CVA tenderness elicited. Musculoskeletal: Nontender with normal range of motion in all extremities.  Neurologic:  Normal gait without ataxia. Normal speech and language. No gross focal neurologic deficits are  appreciated. Skin:  Skin is warm, dry and intact. No rash noted. Psychiatric: Mood and affect are normal. Patient exhibits appropriate insight and judgment. ____________________________________________   LABS (pertinent positives/negatives) Labs Reviewed  COMPREHENSIVE METABOLIC PANEL - Abnormal; Notable for the following components:      Result Value   Glucose, Bld 123 (*)    All other components within normal limits  CBC - Abnormal; Notable for the following components:   WBC 12.7 (*)    All other components within normal limits  URINALYSIS, COMPLETE (UACMP) WITH MICROSCOPIC - Abnormal; Notable for the following components:   Color, Urine YELLOW (*)    APPearance CLOUDY (*)    Hgb urine dipstick MODERATE (*)    All other components within normal limits  LIPASE, BLOOD  ____________________________________________   RADIOLOGY  CT Renal Stone Study  IMPRESSION: Mild left hydroureteronephrosis due to 3 mm distal left ureteral calculus. ____________________________________________  PROCEDURES  Percocet 5-325 mg PO Zofran 4 mg ODT Tamsuloin 0.4 mg PO NS 1000 ml bolus  Procedures ____________________________________________  INITIAL IMPRESSION / ASSESSMENT AND PLAN / ED COURSE  Differential diagnosis includes, but is not limited to, acute appendicitis, renal colic, testicular torsion, urinary tract infection/pyelonephritis, prostatitis,  epididymitis, diverticulitis, small bowel obstruction or ileus, colitis, abdominal aortic aneurysm, gastroenteritis, hernia, etc.  Presents to the ED with sudden onset of left flank pain and nausea with vomiting.  Patient clinical picture concerning for probable nephrolithiasis.  Labs are reassuring despite a mild elevation white count at 12.7.  Urinalysis did reveal some amorphous crystals but no bacteria or hematuria.  Patient was found to have a 3 mm nonobstructing stone in the left distal ureter mild hydroureteronephrosis is also  revealed.  Patient is treated with a fluid bolus, Percocet, Zofran, and tamsulosin in the ED.  He is encouraged to continue to hydrate with noncarbonated drinks and nebulizer regularly.  He will follow-up with urology for ongoing symptoms.  He is advised on the self-limited course of this small stone.  Return precautions have been reviewed and understood.  Jeff [name] was evaluated in Emergency Department on 05/17/2020 for the symptoms described in the history of present illness. He was evaluated in the context of the global COVID-19 pandemic, which necessitated consideration that the patient might be at risk for infection with the SARS-CoV-2 virus that causes COVID-19. Institutional protocols and algorithms that pertain to the evaluation of patients at risk for COVID-19 are in a state of rapid change based on information released by regulatory bodies including the CDC and federal and state organizations. These policies and algorithms were followed during the patient's care in the ED.  I reviewed the patient's prescription history over the last 12 months in the multi-state controlled substances database(s) that includes [place], [place], [place], [place], [place], [place], [place], [place], [place], [place], [place], [place], [place], and [place].  Results were notable for no RX history. ____________________________________________  FINAL CLINICAL IMPRESSION(S) / ED DIAGNOSES  Final diagnoses:  Kidney stone on left side      Karmen Stabs, Charlesetta Ivory, PA-C 05/17/20 2123    Chesley Noon, MD 05/17/20 [phone]

## 2020-05-17 NOTE — ED Triage Notes (Signed)
PT to ED from home c/o side pain that "started out of nowhere" 2 hours ago. PT has vomiting, appears uncomfortable. NO fam hx of kidney stones.

## 2020-10-25 NOTE — Progress Notes (Signed)
Cardiology Office Note    Date:  10/26/2020   ID:  Jeff [name], DOB 10/16/1996, MRN [id number]  PCP:  Patient, No Pcp Per  Cardiologist:  Julien Nordmann, MD  Electrophysiologist:  None   Chief Complaint: Follow up  History of Present Illness:   Jeff [name] is a 24 y.o. male with history of palpitations/tachycardia, tobacco and marijuana use, and chest pain who presents for follow up of palpitations.  Prior evaluations in the ED for atypical/pleuritc chest pain and palpitations in 2018 and 2019. Heart rate improved from the 120s bpm to the 70s bpm with IV fluids. In 01/2019, he contacted our office with reported heart rate of 176 bpm on his Apple Watch with associated tachypalpitations and chest tightness. Zio patch was placed and showed sinus rhythm with an average rate of 85 bpm. There was 1 run of SVT lasting 6 beats with a maximum rate of 118 bpm. Rare PACs and PVCs were noted. Patient triggered events corresponded to sinus tachycardia.  He was last seen virtually in 03/2019, and was somewhat improved, though still having some symptoms. He was started on Toprol XL 25 mg daily and continued on prn propranolol.   He comes in doing well from a cardiac perspective.  He does continue to note intermittent palpitations though these seem to be improved when compared to his prior episodes.  He is no longer taking standing metoprolol.  He has not needed any as needed propanolol in quite some time.  He also notes some intermittent abdominal and chest discomfort described as "twinges".  These episodes will last for a split second at a time and spontaneously resolved.  They are not associated with exertion.  He drinks 1 caffeinated coffee per day was approximately 4 shots of espresso in it.  He continues to smoke marijuana though is tapering his use.  He denies any tobacco use or other illicit substances.  He does note some shortness of breath at times with his palpitations though denies any  dizziness, presyncope, or syncope.  No lower extremity swelling.  Indicates he tries to drink 64 ounces of water daily.   Labs independently reviewed: 04/2020 - HGB 13.9, PLT 282, potassium 3.7, BUN 18, SCr 1.06, albumin 4.8, AST/ALT normal 01/2019 - TSH normal  Past Medical History:  Diagnosis Date  . Marijuana abuse   . Palpitations   . Tobacco abuse     History reviewed. No pertinent surgical history.  Current Medications: No outpatient medications have been marked as taking for the 10/26/20 encounter (Office Visit) with Sondra Barges, PA-C.    Allergies:   Patient has no known allergies.   Social History   Socioeconomic History  . Marital status: Single    Spouse name: Not on file  . Number of children: Not on file  . Years of education: Not on file  . Highest education level: Not on file  Occupational History  . Not on file  Tobacco Use  . Smoking status: Former Smoker    Packs/day: 0.50    Types: Cigarettes  . Smokeless tobacco: Never Used  Substance and Sexual Activity  . Alcohol use: Yes    Comment: socially 2xmonth  . Drug use: Yes    Types: Marijuana    Comment: daily   . Sexual activity: Not on file  Other Topics Concern  . Not on file  Social History Narrative   Lives locally.  Works at [employer] in the mornings and [employer] in the  afternoon/evenings.   Social Determinants of Health   Financial Resource Strain:   . Difficulty of Paying Living Expenses: Not on file  Food Insecurity:   . Worried About Programme researcher, broadcasting/film/video in the Last Year: Not on file  . Ran Out of Food in the Last Year: Not on file  Transportation Needs:   . Lack of Transportation (Medical): Not on file  . Lack of Transportation (Non-Medical): Not on file  Physical Activity:   . Days of Exercise per Week: Not on file  . Minutes of Exercise per Session: Not on file  Stress:   . Feeling of Stress : Not on file  Social Connections:   . Frequency of Communication with Friends  and Family: Not on file  . Frequency of Social Gatherings with Friends and Family: Not on file  . Attends Religious Services: Not on file  . Active Member of Clubs or Organizations: Not on file  . Attends Banker Meetings: Not on file  . Marital Status: Not on file     Family History:  The patient's family history includes Heart attack in his paternal uncle; Hypertension in his mother.  ROS:   Review of Systems  Constitutional: Negative for chills, diaphoresis, fever, malaise/fatigue and weight loss.  HENT: Negative for congestion.   Eyes: Negative for discharge and redness.  Respiratory: Negative for cough, sputum production, shortness of breath and wheezing.   Cardiovascular: Positive for chest pain and palpitations. Negative for orthopnea, claudication, leg swelling and PND.  Gastrointestinal: Positive for abdominal pain. Negative for heartburn, nausea and vomiting.  Musculoskeletal: Negative for falls and myalgias.  Skin: Negative for rash.  Neurological: Negative for dizziness, tingling, tremors, sensory change, speech change, focal weakness, loss of consciousness and weakness.  Endo/Heme/Allergies: Does not bruise/bleed easily.  Psychiatric/Behavioral: Negative for substance abuse. The patient is not nervous/anxious.   All other systems reviewed and are negative.    EKGs/Labs/Other Studies Reviewed:    Studies reviewed were summarized above. The additional studies were reviewed today:  Zio 01/2019: Avg HR of 85 bpm.   1 run of Supraventricular Tachycardia/atrial tachycardia occurred lasting 6 beats with a max rate of 118 bpm (avg 102 bpm).   Isolated SVEs were rare (<1.0%), and no SVE Couplets or SVE Triplets were present. Isolated VEs were rare (<1.0%), and no VE Couplets or VE Triplets were present.  Patient triggered events were associated with sinus tachycardia    EKG:  EKG is ordered today.  The EKG ordered today demonstrates NSR, 70 bpm, sinus  arrhythmia, possible LVH, baseline artifact, no acute ST-T changes  Recent Labs: 05/17/2020: ALT 11; BUN 18; Creatinine, Ser 1.06; Hemoglobin 13.9; Platelets 282; Potassium 3.7; Sodium 140  Recent Lipid Panel No results found for: CHOL, TRIG, HDL, CHOLHDL, VLDL, LDLCALC, LDLDIRECT  PHYSICAL EXAM:    VS:  BP 122/82 (BP Location: Left Arm, Patient Position: Sitting, Cuff Size: Normal)   Pulse 70   Ht 5\' 11"  (1.803 m)   Wt 144 lb 8 oz (65.5 kg)   SpO2 98%   BMI 20.15 kg/m   BMI: Body mass index is 20.15 kg/m.  Physical Exam Constitutional:      Appearance: He is well-developed.  HENT:     Head: Normocephalic and atraumatic.  Eyes:     General:        Right eye: No discharge.        Left eye: No discharge.  Neck:     Vascular: No JVD.  Cardiovascular:     Rate and Rhythm: Normal rate and regular rhythm.     Pulses: No midsystolic click and no opening snap.          Posterior tibial pulses are 2+ on the right side and 2+ on the left side.     Heart sounds: Normal heart sounds, S1 normal and S2 normal. Heart sounds not distant. No murmur heard.  No friction rub.  Pulmonary:     Effort: Pulmonary effort is normal. No respiratory distress.     Breath sounds: Normal breath sounds. No decreased breath sounds, wheezing or rales.  Chest:     Chest wall: No tenderness.  Abdominal:     General: There is no distension.     Palpations: Abdomen is soft.     Tenderness: There is no abdominal tenderness.  Musculoskeletal:     Cervical back: Normal range of motion.  Skin:    General: Skin is warm and dry.     Nails: There is no clubbing.  Neurological:     Mental Status: He is alert and oriented to person, place, and time.  Psychiatric:        Speech: Speech normal.        Behavior: Behavior normal.        Thought Content: Thought content normal.        Judgment: Judgment normal.     Wt Readings from Last 3 Encounters:  10/26/20 144 lb 8 oz (65.5 kg)  05/17/20 140 lb (63.5 kg)   02/04/19 150 lb 8 oz (68.3 kg)     ASSESSMENT & PLAN:   1. Tachypalpitations with associated dyspnea: Overall, his tachypalpitations are improved.  He prefers to remain off standing medication.  Refill as needed propanolol for tachypalpitations.  Given history of palpitations and noted dyspnea we will obtain an echo to evaluate for structural heart disease.    2. Atypical chest pain: He reports intermittent randomly occurring split-second episodes of chest pain described as "twinges".  Symptoms are atypical for angina.  Schedule echo as outlined above to evaluate for any structural abnormalities.  3. Marijuana use: Complete cessation recommended.  Disposition: F/u with Dr. Mariah Milling or an APP in 3 months.   Medication Adjustments/Labs and Tests Ordered: Current medicines are reviewed at length with the patient today.  Concerns regarding medicines are outlined above. Medication changes, Labs and Tests ordered today are summarized above and listed in the Patient Instructions accessible in Encounters.   Signed, Eula Listen, PA-C 10/26/2020 10:17 AM     [facility] [place] [facility] [address] [place], [place] [address] [phone]

## 2020-10-26 ENCOUNTER — Other Ambulatory Visit: Payer: Self-pay

## 2020-10-26 ENCOUNTER — Encounter: Payer: Self-pay | Admitting: Physician Assistant

## 2020-10-26 ENCOUNTER — Ambulatory Visit: Payer: 59 | Admitting: Physician Assistant

## 2020-10-26 VITALS — BP 122/82 | HR 70 | Ht 71.0 in | Wt 144.5 lb

## 2020-10-26 DIAGNOSIS — F1210 Cannabis abuse, uncomplicated: Secondary | ICD-10-CM | POA: Diagnosis not present

## 2020-10-26 DIAGNOSIS — R0602 Shortness of breath: Secondary | ICD-10-CM

## 2020-10-26 DIAGNOSIS — I479 Paroxysmal tachycardia, unspecified: Secondary | ICD-10-CM | POA: Diagnosis not present

## 2020-10-26 DIAGNOSIS — R0789 Other chest pain: Secondary | ICD-10-CM

## 2020-10-26 MED ORDER — PROPRANOLOL HCL 20 MG PO TABS: 20.0000 mg | ORAL_TABLET | Freq: Three times a day (TID) | ORAL | 3 refills | Status: DC | PRN

## 2020-10-26 NOTE — Patient Instructions (Signed)
Medication Instructions:  Your physician recommends that you continue on your current medications as directed. Please refer to the Current Medication list given to you today.  A refill was sent to your pharmacy for Propranolol.   *If you need a refill on your cardiac medications before your next appointment, please call your pharmacy*   Lab Work: None ordered.   If you have labs (blood work) drawn today and your tests are completely normal, you will receive your results only by: Marland Kitchen MyChart Message (if you have MyChart) OR . A paper copy in the mail If you have any lab test that is abnormal or we need to change your treatment, we will call you to review the results.   Testing/Procedures: Your physician has requested that you have an echocardiogram. Echocardiography is a painless test that uses sound waves to create images of your heart. It provides your doctor with information about the size and shape of your heart and how well your heart's chambers and valves are working. This procedure takes approximately one hour. There are no restrictions for this procedure.    Follow-Up: At [facility], you and your health needs are our priority.  As part of our continuing mission to provide you with exceptional heart care, we have created designated Provider Care Teams.  These Care Teams include your primary Cardiologist (physician) and Advanced Practice Providers (APPs -  Physician Assistants and Nurse Practitioners) who all work together to provide you with the care you need, when you need it.  We recommend signing up for the patient portal called "MyChart".  Sign up information is provided on this After Visit Summary.  MyChart is used to connect with patients for Virtual Visits (Telemedicine).  Patients are able to view lab/test results, encounter notes, upcoming appointments, etc.  Non-urgent messages can be sent to your provider as well.   To learn more about what you can do with MyChart, go to  [email or web address].    Your next appointment:   3 month(s)  The format for your next appointment:   In Person  Provider:   You may see Julien Nordmann, MD or one of the following Advanced Practice Providers on your designated Care Team:    Nicolasa Ducking, NP  Eula Listen, PA-C  Marisue Ivan, PA-C  Cadence Fransico Michael, [place]    Other Instructions None.

## 2020-11-15 ENCOUNTER — Other Ambulatory Visit: Payer: Self-pay

## 2020-11-15 ENCOUNTER — Ambulatory Visit (INDEPENDENT_AMBULATORY_CARE_PROVIDER_SITE_OTHER): Payer: 59

## 2020-11-15 DIAGNOSIS — R0602 Shortness of breath: Secondary | ICD-10-CM

## 2020-11-15 LAB — ECHOCARDIOGRAM COMPLETE
Area-P 1/2: 2.69 cm2
S' Lateral: 2.9 cm

## 2020-11-16 ENCOUNTER — Telehealth: Payer: Self-pay | Admitting: *Deleted

## 2020-11-16 NOTE — Telephone Encounter (Signed)
-----   Message from Sondra Barges, PA-C sent at 11/16/2020  7:14 AM EST ----- Echo showed normal pump function, normal wall motion, normal relaxation of the heart, and no significant valvular abnormalities. Overall, reassuring study.

## 2020-11-16 NOTE — Telephone Encounter (Signed)
Pt voiced understanding

## 2021-01-20 ENCOUNTER — Other Ambulatory Visit: Payer: Self-pay | Admitting: Physician Assistant

## 2021-01-27 ENCOUNTER — Ambulatory Visit: Payer: 59 | Admitting: Cardiovascular Disease

## 2021-02-06 ENCOUNTER — Ambulatory Visit: Payer: 59 | Admitting: Cardiovascular Disease

## 2021-02-27 ENCOUNTER — Encounter: Payer: Self-pay | Admitting: Family Medicine

## 2021-02-27 ENCOUNTER — Other Ambulatory Visit: Payer: Self-pay

## 2021-02-27 ENCOUNTER — Ambulatory Visit: Payer: 59 | Admitting: Family Medicine

## 2021-02-27 DIAGNOSIS — L740 Miliaria rubra: Secondary | ICD-10-CM

## 2021-02-27 DIAGNOSIS — Z113 Encounter for screening for infections with a predominantly sexual mode of transmission: Secondary | ICD-10-CM

## 2021-02-27 NOTE — Progress Notes (Signed)
Here for STD testing..Jeff Bukowski Brewer-Jensen, RN  

## 2021-02-27 NOTE — Progress Notes (Signed)
[facility] Department STI clinic/screening visit  Subjective:  Jeff [name] is a 25 y.o. male being seen today for an STI screening visit. The patient reports they do have symptoms.    Patient has the following medical conditions:   Patient Active Problem List   Diagnosis Date Noted  . Paroxysmal tachycardia (HCC) 07/16/2018  . Marijuana abuse 07/16/2018  . Smoker 07/16/2018     Chief Complaint  Patient presents with  . Exposure to STD    HPI  Patient reports here for screening, has "tiny bumps" on penis.     See flowsheet for further details and programmatic requirements.    The following portions of the patient's history were reviewed and updated as appropriate: allergies, current medications, past medical history, past social history, past surgical history and problem list.  Objective:  There were no vitals filed for this visit.  Physical Exam Constitutional:      Appearance: Normal appearance.  HENT:     Head: Normocephalic.     Mouth/Throat:     Mouth: Mucous membranes are moist.     Pharynx: Oropharynx is clear. No oropharyngeal exudate.  Pulmonary:     Effort: Pulmonary effort is normal.  Genitourinary:    Penis: Normal.      Testes: Normal.     Comments: No lice, nits, or pest, no lesions or odor discharge.  Denies pain or tenderness with paplation of testicles.     Patient has multiple < 1 mm flesh colored papules on the left side of shaft near the corona and also on corona.  No lesions, ulcers or masses present.   Musculoskeletal:     Cervical back: Normal range of motion.  Lymphadenopathy:     Cervical: No cervical adenopathy.  Skin:    General: Skin is warm and dry.     Findings: No bruising, erythema, lesion or rash.  Neurological:     Mental Status: He is alert and oriented to person, place, and time.  Psychiatric:        Mood and Affect: Mood normal.        Behavior: Behavior normal.       Assessment and Plan:  Jeff [name] is a 25 y.o. male presenting to the [facility] Department for STI screening  1. Screening examination for venereal disease - HBV Antigen/Antibody [facility] - HIV/HCV [place] [facility] - Syphilis Serology, [place] [facility] - Gonococcus culture  Patient does have STI symptoms Patient accepted all screenings including Oral GC and bloodwork for HIV/RPR.  Declined need for penile screening, last penile contact was > 8 months ago, per patient.  No gram stain or urethral screening.  Denies s/sx from genitals .   Patient meets criteria for HepB screening? Yes. Ordered? Yes Patient meets criteria for HepC screening? Yes. Ordered? Yes Recommended condom use with all sex Discussed importance of condom use for STI prevent   Discussed time line for State Lab results and that patient will be called with positive results and encouraged patient to call if he had not heard in 2 weeks Recommended returning for continued or worsening symptoms.   2. Heat rash Flesh colored papules on shaft of left side of penis and left side of corona x 1 month.  No changes since papules.   Patient reports occasional itching.  Denies any blistering, opening or weeping.   Patient reports sweating in genital area while working.   Recommend wearing cotton under wear, cleaning Genital  area with antibacterial soap 2 x per day and using OTC hydrocortisone cream for itching if needed.   Patient verbalizes understanding.    Request patient to RTC if any if the papules open or if more appear.      Return if symptoms worsen or fail to improve.  No future appointments.  Wendi Snipes, FNP

## 2021-03-04 LAB — GONOCOCCUS CULTURE

## 2021-11-26 IMAGING — CT CT RENAL STONE PROTOCOL
3 of 4 series · 8 of 46 positions shown, 15 images · non-contrast
Comparison: None.

CLINICAL DATA: Acute onset flank pain and vomiting approximately 2
hours ago.

EXAM:
CT ABDOMEN AND PELVIS WITHOUT CONTRAST
TECHNIQUE: Multidetector CT imaging of the abdomen and pelvis was performed
following the standard protocol without IV contrast.

[Series 4: lung bases · axial · 0.66mm/px · z∈[-787,-727]mm · 4 of 21 slices shown, 9 images]
[im 5/21  soft-tissue]
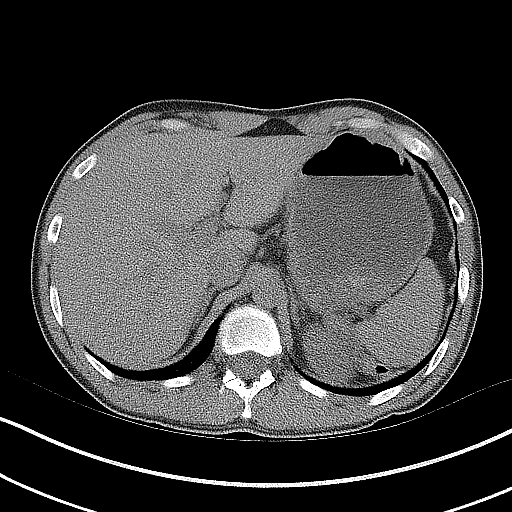
[im 5/21  lung]
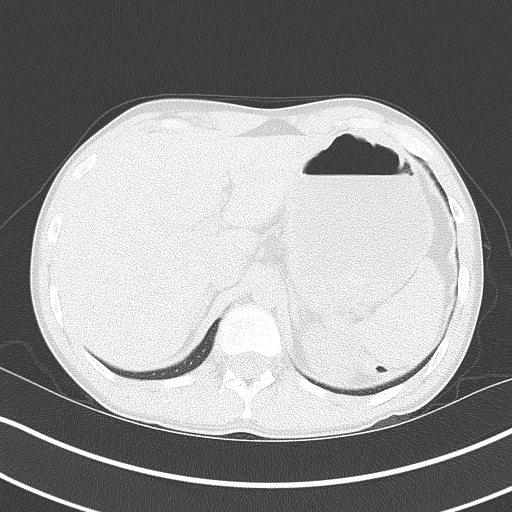
[im 5/21  bone]
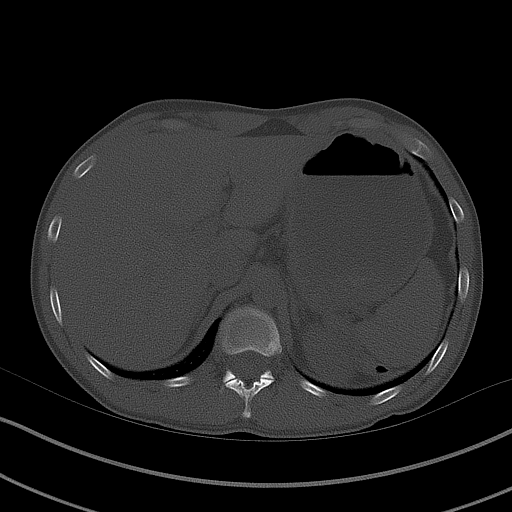
[im 9/21  soft-tissue]
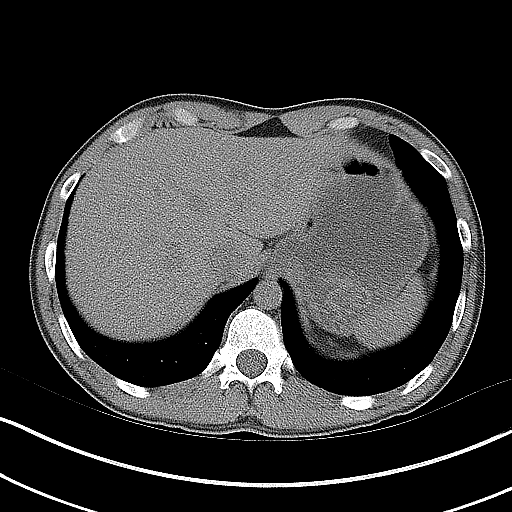
[im 9/21  lung]
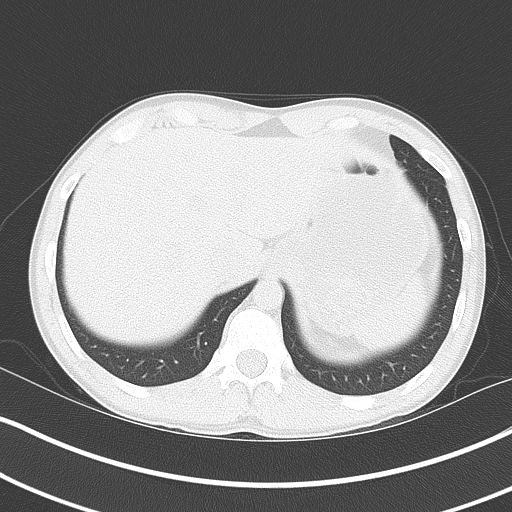
[im 13/21  soft-tissue]
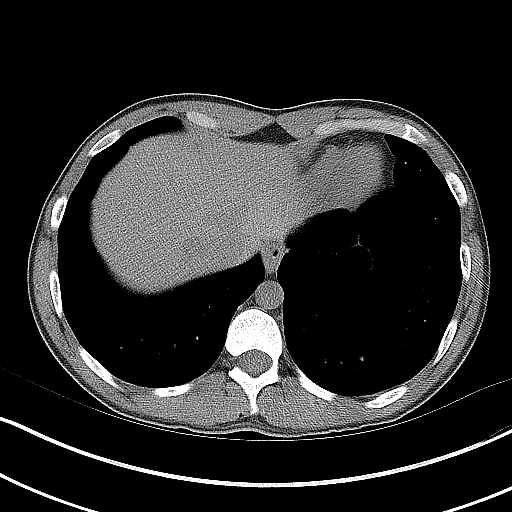
[im 13/21  lung]
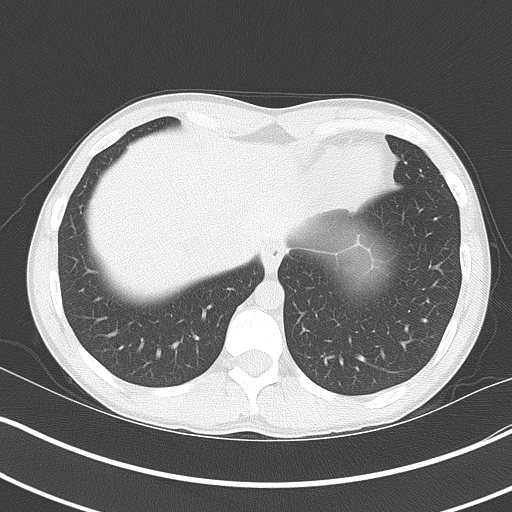
[im 17/21  soft-tissue]
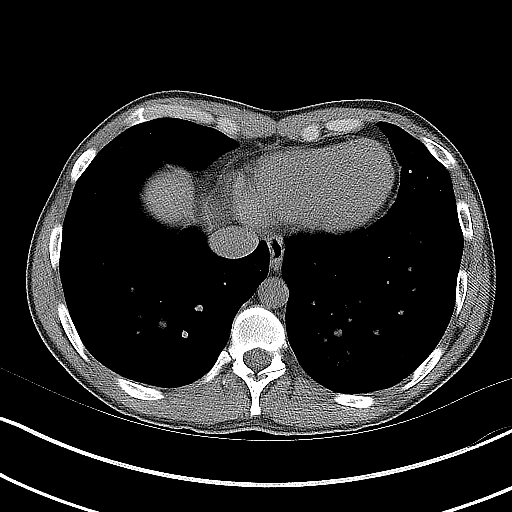
[im 17/21  lung]
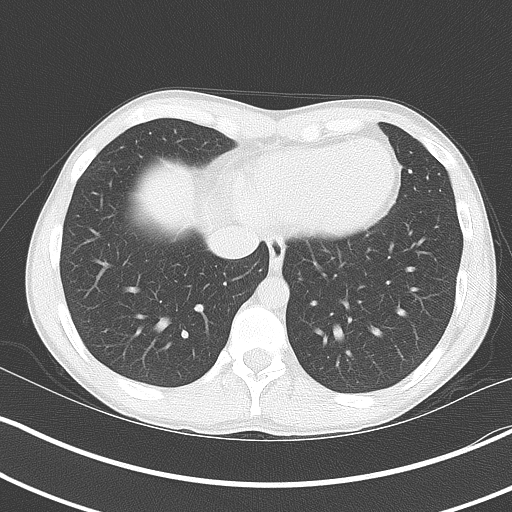

[Series 5: coronal · coronal · 0.63mm/px · 3 of 112 slices shown, 4 images]
[im 38/112  soft-tissue]
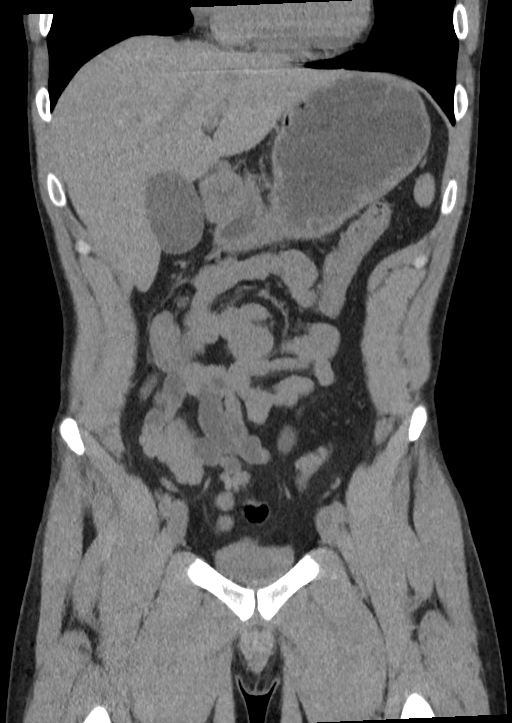
[im 50/112  soft-tissue]
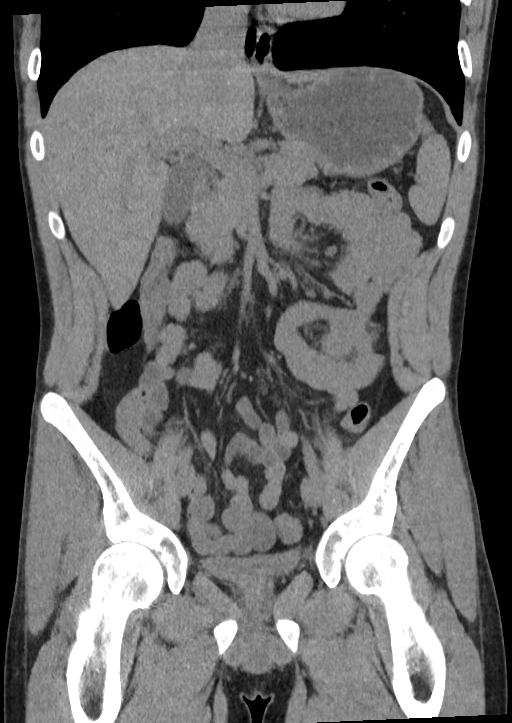
[im 50/112  bone]
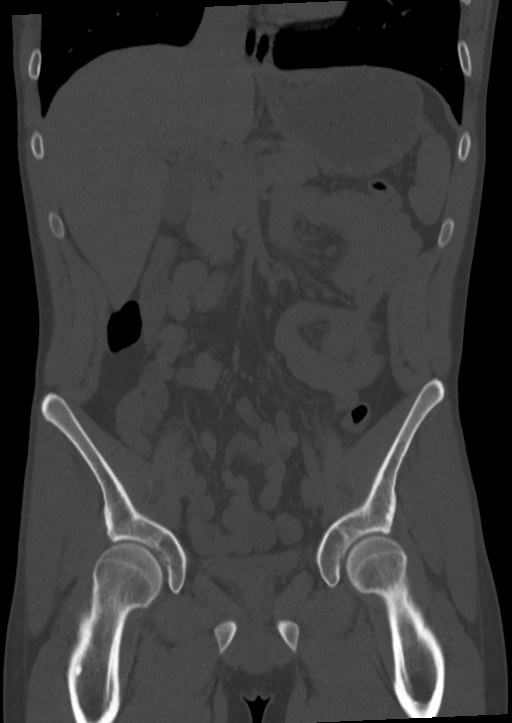
[im 62/112  soft-tissue]
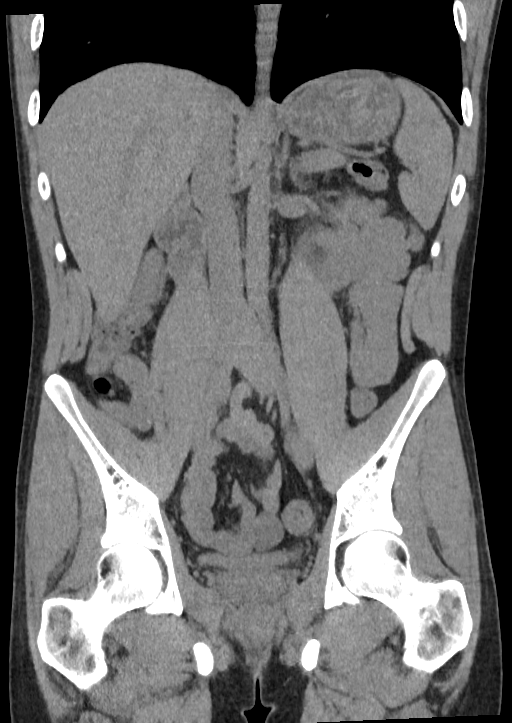

[Series 6: sagittal · sagittal · 0.46mm/px · 1 of 149 slices shown, 2 images]
[im 50/149  soft-tissue]
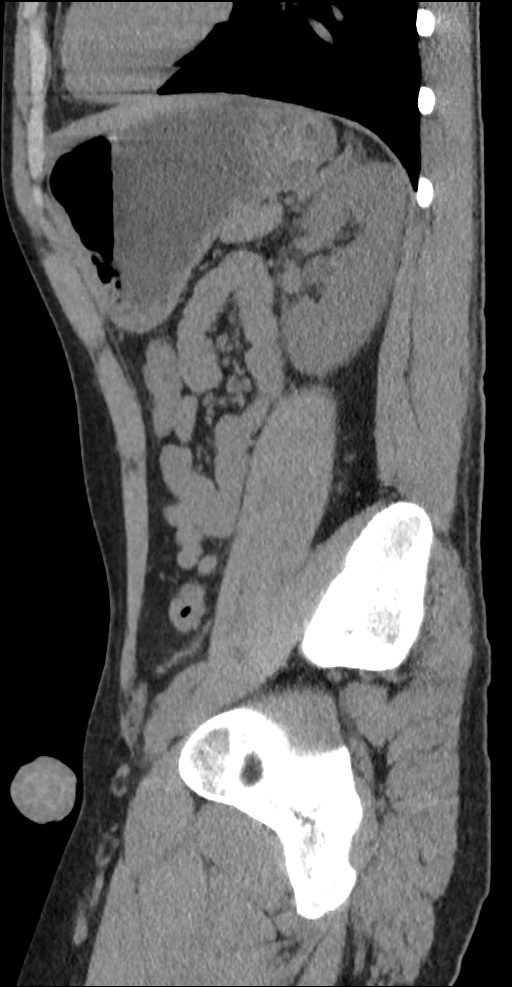
[im 50/149  bone]
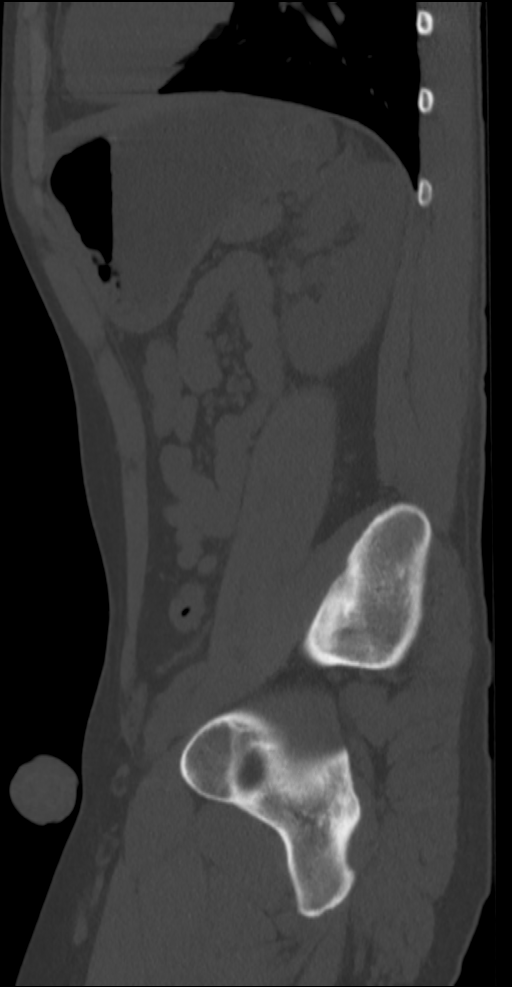

[8 of 46 positions shown; findings below may reference images not displayed]

FINDINGS: Lower chest: No acute findings.

Hepatobiliary: No mass visualized on this unenhanced exam.
Gallbladder is unremarkable. No evidence of biliary ductal
dilatation.

Pancreas: No mass or inflammatory process visualized on this
unenhanced exam.

Spleen:  Within normal limits in size.

Adrenals/Urinary tract: Mild left hydroureteronephrosis is seen due
to a 3 mm calculus in the distal left ureter.

Stomach/Bowel: No evidence of obstruction, inflammatory process, or
abnormal fluid collections. Normal appendix visualized.

Vascular/Lymphatic: No pathologically enlarged lymph nodes
identified. No evidence of abdominal aortic aneurysm.

Reproductive:  No mass or other significant abnormality.

Other:  None.

Musculoskeletal:  No suspicious bone lesions identified.
IMPRESSION: Mild left hydroureteronephrosis due to 3 mm distal left ureteral
calculus.

## 2022-07-15 ENCOUNTER — Ambulatory Visit (INDEPENDENT_AMBULATORY_CARE_PROVIDER_SITE_OTHER): Payer: Self-pay

## 2022-07-15 ENCOUNTER — Ambulatory Visit
Admission: EM | Admit: 2022-07-15 | Discharge: 2022-07-15 | Disposition: A | Discharge status: Home / Self Care | Payer: 59 | Attending: Emergency Medicine | Admitting: Emergency Medicine

## 2022-07-15 ENCOUNTER — Encounter: Payer: Self-pay | Admitting: Emergency Medicine

## 2022-07-15 DIAGNOSIS — M7989 Other specified soft tissue disorders: Secondary | ICD-10-CM

## 2022-07-15 DIAGNOSIS — S62655A Nondisplaced fracture of medial phalanx of left ring finger, initial encounter for closed fracture: Secondary | ICD-10-CM

## 2022-07-15 NOTE — ED Provider Notes (Signed)
Jeff [name]    CSN: [id number] Arrival date & time: 07/15/22  1422      History   Chief Complaint Chief Complaint  Patient presents with   Finger Injury    HPI Jeff [name] is a 26 y.o. male.   HPI  26 year old male here for evaluation of orthopedic complaints.  Patient reports that he was tubing 8 days ago when the child slipped and his left fourth finger became caught in the tow rope.  He states that he has had pain, swelling, and a mild deformity of the tip of his fourth finger since then.  He states that after the initial accident he fell off the floor twice more.  Since the time of the accident he is been wearing a finger splint that he bought pharmacy.  He states that he has full sensation in his fingertip but he has decreased range of motion at the distal joint.  Past Medical History:  Diagnosis Date   Marijuana abuse    Palpitations    Tobacco abuse     Patient Active Problem List   Diagnosis Date Noted   Paroxysmal tachycardia (HCC) 07/16/2018   Marijuana abuse 07/16/2018   Smoker 07/16/2018    History reviewed. No pertinent surgical history.     Home Medications    Prior to Admission medications   Medication Sig Start Date End Date Taking? Authorizing Provider  propranolol (INDERAL) 20 MG tablet TAKE 1 TABLET BY MOUTH 3 TIMES DAILY AS NEEDED. Patient not taking: Reported on 02/27/2021 01/20/21   Sondra Barges, PA-C    Family History Family History  Problem Relation Age of Onset   Hypertension Mother    Heart attack Paternal Uncle     Social History Social History   Tobacco Use   Smoking status: Former    Packs/day: 0.50    Types: Cigarettes   Smokeless tobacco: Never  Substance Use Topics   Alcohol use: Yes    Comment: socially 2xmonth   Drug use: Yes    Frequency: 7.0 times per week    Types: Marijuana    Comment: daily      Allergies   Patient has no known allergies.   Review of Systems Review of Systems   Musculoskeletal:  Positive for arthralgias and joint swelling. Negative for myalgias.  Skin:  Negative for color change.  Neurological:  Negative for weakness and numbness.  Hematological: Negative.   Psychiatric/Behavioral: Negative.       Physical Exam Triage Vital Signs ED Triage Vitals  Enc Vitals Group     BP      Pulse      Resp      Temp      Temp src      SpO2      Weight      Height      Head Circumference      Peak Flow      Pain Score      Pain Loc      Pain Edu?      Excl. in [place]?    No data found.  Updated Vital Signs BP 128/81 (BP Location: Right Arm)   Pulse 80   Temp 98.1 F (36.7 C) (Oral)   Resp 15   SpO2 98%   Visual Acuity Right Eye Distance:   Left Eye Distance:   Bilateral Distance:    Right Eye Near:   Left Eye Near:    Bilateral Near:  Physical Exam Vitals and nursing note reviewed.  Constitutional:      Appearance: Normal appearance. He is not ill-appearing.  HENT:     Head: Normocephalic and atraumatic.  Musculoskeletal:        General: Swelling, tenderness, deformity and signs of injury present.  Skin:    General: Skin is warm and dry.     Capillary Refill: Capillary refill takes less than 2 seconds.     Findings: No bruising or erythema.  Neurological:     General: No focal deficit present.     Mental Status: He is alert and oriented to person, place, and time.     Sensory: No sensory deficit.     Motor: No weakness.  Psychiatric:        Mood and Affect: Mood normal.        Behavior: Behavior normal.        Thought Content: Thought content normal.        Judgment: Judgment normal.      UC Treatments / Results  Labs (all labs ordered are listed, but only abnormal results are displayed) Labs Reviewed - No data to display  EKG   Radiology DG Finger Ring Left  Result Date: 07/15/2022 CLINICAL DATA:  Eight days of left ring finger pain after water tubing and finger was caught by handle. DIP joint pain. EXAM:  LEFT RING FINGER 2+V COMPARISON:  None Available. FINDINGS: There is longitudinal linear lucency within the distal medial aspect of the middle phalanx of the fourth finger with intra-articular extension. There is up to 1 mm proximal displacement of the distal medial fracture component. Moderate adjacent medial soft tissue swelling.  No dislocation. IMPRESSION: Linear lucency within the distal medial aspect of the middle phalanx of the fourth finger with adjacent soft tissue swelling is suspicious for an acute to subacute fracture given patient history. There is up to 1 mm proximal displacement of the distal medial fracture component. Electronically Signed   By: Neita Garnet M.D.   On: 07/15/2022 15:03    Procedures Procedures (including critical care time)  Medications Ordered in UC Medications - No data to display  Initial Impression / Assessment and Plan / UC Course  I have reviewed the triage vital signs and the nursing notes.  Pertinent labs & imaging results that were available during my care of the patient were reviewed by me and considered in my medical decision making (see chart for details).  Patient is a very pleasant, nontoxic-appearing 26 year old male here for evaluation of pain and swelling lasting days since opening off of the innertube and getting his finger caught in a tow rope.  Patient denies any numbness or tingling to his finger.  He has full range of motion of the MCP joint and PIP joint but has decreased range of motion of the DIP joint.  On exam patient.  Is in normal anatomical alignment but there is a slight twist to the distal aspect of the left fourth finger.  There is also some mild swelling to the medial aspect of the DIP joint.  Patient's cap refill is less than 2 seconds.  He does have some tenderness with passive range of motion of the DIP joint.  There is no tenderness with compression of the distal phalanx, middle phalanx, proximal phalanx body.  No erythema or  ecchymosis noted.  We will do radiograph to look for bony derangement.  Left fourth finger x-ray independently reviewed and evaluated by me.  Impression: There is  a chip fracture of the distal, lateral aspect of the middle phalanx.  Radiology overread is pending. Radiology impression states there is a linear lucency within the distal medial aspect of the middle phalanx of the fourth finger with adjacent soft tissue swelling is suspicious for an acute to subacute fracture given patient history.  Discharge patient home with a diagnosis of acute fracture middle phalanx of left fourth finger and have him continue to wear his splint for another week.  We will have him take off his splint periodically to perform range of motion exercises.  He can take Tylenol and ibuprofen according to package instructions as needed for pain.     Final Clinical Impressions(s) / UC Diagnoses   Final diagnoses:  Swollen finger  Closed nondisplaced fracture of middle phalanx of left ring finger, initial encounter     Discharge Instructions      Your x-rays today showed a small chip of bone has been broken with your finger.  It shows signs that it is already starting to heal.  Continue to wear your splint for another week to help protect your finger.  Take the splint off.  He fell today and perform range of motion exercises.  Range of motion exercises will consist of opening and closing his hand to keep mobility in the joint.  She can use over-the-counter Tylenol and ibuprofen according to package instructions as needed for pain.     ED Prescriptions   None    PDMP not reviewed this encounter.   Becky Augusta, NP 07/15/22 [phone]

## 2022-07-15 NOTE — ED Triage Notes (Signed)
Pt reports was tubing behind a boat Saturday over week ago, when flipped off left 4th finger got caught. Pt c/o pain and swelling with deformity since. 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 Reports pain with upper joint of finger.

## 2022-07-15 NOTE — Discharge Instructions (Addendum)
Your x-rays today showed a small chip of bone has been broken with your finger.  It shows signs that it is already starting to heal.  Continue to wear your splint for another week to help protect your finger.  Take the splint off.  He fell today and perform range of motion exercises.  Range of motion exercises will consist of opening and closing his hand to keep mobility in the joint.  She can use over-the-counter Tylenol and ibuprofen according to package instructions as needed for pain.

## 2023-01-25 ENCOUNTER — Ambulatory Visit
Admission: EM | Admit: 2023-01-25 | Discharge: 2023-01-25 | Disposition: A | Discharge status: Home / Self Care | Payer: Self-pay | Attending: Emergency Medicine | Admitting: Emergency Medicine

## 2023-01-25 DIAGNOSIS — R21 Rash and other nonspecific skin eruption: Secondary | ICD-10-CM

## 2023-01-25 DIAGNOSIS — R1031 Right lower quadrant pain: Secondary | ICD-10-CM

## 2023-01-25 MED ORDER — PREDNISONE 20 MG PO TABS: 40.0000 mg | ORAL_TABLET | Freq: Every day | ORAL | 0 refills | Status: DC

## 2023-01-25 MED ORDER — TRIAMCINOLONE ACETONIDE 0.1 % EX CREA: 1.0000 | TOPICAL_CREAM | Freq: Two times a day (BID) | CUTANEOUS | 0 refills | Status: DC

## 2023-01-25 MED ORDER — FAMOTIDINE 20 MG PO TABS: 20.0000 mg | ORAL_TABLET | Freq: Two times a day (BID) | ORAL | 0 refills | Status: AC

## 2023-01-25 NOTE — ED Provider Notes (Signed)
Jeff [name]    CSN: [id number] Arrival date & time: 01/25/23  1402      History   Chief Complaint Chief Complaint  Patient presents with   Rash   Abdominal Pain    HPI Jeff [name] is a 27 y.o. male.   Patient presents for evaluation of intermittent right lower quadrant pain present for at least 5 months.  Pain is described as a tightness.  Does not radiate, typically worse at nighttime.  Occurs independently of consumption of food and liquids.  Last bowel movement this morning, denies diarrhea or constipation.  Endorses history of acid reflux, not currently taking medication, unsure if started prior to or after symptoms.  Has not attempted treatment.  Denies abdominal bloating, increased gas production, nausea, vomiting, fevers, URI symptoms, urinary symptoms.  Patient concerned with rash present to the left side of neck for 2 weeks, believes it has started to spread onto the face.  Rash is pruritic, nondraining or painful.  Initially appeared in a circular appearance which she believed was ringworm and therefore attempted use of antifungal medication which did not relieve symptoms.  Denies changes in toiletries, diet or recent travel.  Symptoms have not occurred before.  Past Medical History:  Diagnosis Date   Marijuana abuse    Palpitations    Tobacco abuse     Patient Active Problem List   Diagnosis Date Noted   Paroxysmal tachycardia ([facility]) 07/16/2018   Marijuana abuse 07/16/2018   Smoker 07/16/2018    History reviewed. No pertinent surgical history.     Home Medications    Prior to Admission medications   Medication Sig Start Date End Date Taking? Authorizing Provider  famotidine (PEPCID) 20 MG tablet Take 1 tablet (20 mg total) by mouth 2 (two) times daily. 01/25/23  Yes Katasha Riga R, NP  predniSONE (DELTASONE) 20 MG tablet Take 2 tablets (40 mg total) by mouth daily. 01/25/23  Yes Amjad Fikes R, NP  triamcinolone cream (KENALOG) 0.1 % Apply 1  Application topically 2 (two) times daily. 01/25/23  Yes Sumaya Riedesel R, NP  propranolol (INDERAL) 20 MG tablet TAKE 1 TABLET BY MOUTH 3 TIMES DAILY AS NEEDED. Patient not taking: Reported on 02/27/2021 01/20/21   Rise Mu, PA-C    Family History Family History  Problem Relation Age of Onset   Hypertension Mother    Heart attack Paternal Uncle     Social History Social History   Tobacco Use   Smoking status: Former    Packs/day: 0.50    Types: Cigarettes   Smokeless tobacco: Never  Substance Use Topics   Alcohol use: Yes    Comment: socially 2xmonth   Drug use: Yes    Frequency: 7.0 times per week    Types: Marijuana    Comment: daily      Allergies   Patient has no known allergies.   Review of Systems Review of Systems Defer to HPI   Physical Exam Triage Vital Signs ED Triage Vitals  Enc Vitals Group     BP 01/25/23 1422 (!) 137/96     Pulse Rate 01/25/23 1422 86     Resp 01/25/23 1422 18     Temp 01/25/23 1422 98.1 F (36.7 C)     Temp Source 01/25/23 1422 Oral     SpO2 01/25/23 1422 98 %     Weight --      Height --      Head Circumference --  Peak Flow --      Pain Score 01/25/23 1432 0     Pain Loc --      Pain Edu? --      Excl. in [place]? --    No data found.  Updated Vital Signs BP (!) 137/96 (BP Location: Left Arm)   Pulse 86   Temp 98.1 F (36.7 C) (Oral)   Resp 18   SpO2 98%   Visual Acuity Right Eye Distance:   Left Eye Distance:   Bilateral Distance:    Right Eye Near:   Left Eye Near:    Bilateral Near:     Physical Exam Constitutional:      Appearance: He is well-developed.  Eyes:     Extraocular Movements: Extraocular movements intact.  Pulmonary:     Effort: Pulmonary effort is normal.  Abdominal:     General: Abdomen is flat. Bowel sounds are normal.     Palpations: Abdomen is soft.     Tenderness: There is abdominal tenderness in the right upper quadrant.  Skin:    Comments: Erythematous macular lesions  present over the anterior and lateral aspects of the neck, the left eyelid and the bilateral cheeks, flaking noted along the skin  Neurological:     Mental Status: He is alert and oriented to person, place, and time. Mental status is at baseline.      UC Treatments / Results  Labs (all labs ordered are listed, but only abnormal results are displayed) Labs Reviewed - No data to display  EKG   Radiology No results found.  Procedures Procedures (including critical care time)  Medications Ordered in UC Medications - No data to display  Initial Impression / Assessment and Plan / UC Course  I have reviewed the triage vital signs and the nursing notes.  Pertinent labs & imaging results that were available during my care of the patient were reviewed by me and considered in my medical decision making (see chart for details).  Right lower quadrant abdominal pain, rash  Tenderness is noted on exam however mild, low suspicion for an infectious cause due to timeline of illness, low suspicion for an acute abdomen due to timeline of illness unable to palpate the liver low suspicion for involvement, discussed all this with patient, as he is experiencing acid reflux intermittently it could be a result and therefore started on famotidine and advised a bland diet, PCP did place so that he may follow-up, may follow-up with his urgent care as needed  Rash appears to be inflammatory very similar in appearance to atopic dermatitis, discussed this with patient, prescribed prednisone burst as well as triamcinolone cream for management and advise moisturizing skin daily, may follow-up with his urgent care as needed Final Clinical Impressions(s) / UC Diagnoses   Final diagnoses:  RLQ abdominal pain  Rash and nonspecific skin eruption     Discharge Instructions      -Rash in the neck appears to be a form of eczema which is an inflammatory process related to dry skin Begin prednisone every morning  with food for 5 days to stop the inflammatory process and begin clear rash -  may use triamcinolone cream twice daily to spot treat the area until cleared after you have taken oral medication -Ensure that skin is moisturize well after washing, may use petroleum or Aquaphor or similar product -May follow-up with urgent care as needed  It is possible your abdominal pain is related to your acid reflux, low suspicion  for an infectious cause due to timeline of your illness, unable to feel your liver do not think this is the cause of symptoms -Begin famotidine every morning and every evening for 14 days -While taking medication eating a blander diet to prevent further abdominal irritation - A primary care referral has been placed for you to help you find a doctor therefore if your symptoms continue to persist you may consider follow-up treatment and evaluation   ED Prescriptions     Medication Sig Dispense Auth. Provider   predniSONE (DELTASONE) 20 MG tablet Take 2 tablets (40 mg total) by mouth daily. 10 tablet Melena Hayes R, NP   triamcinolone cream (KENALOG) 0.1 % Apply 1 Application topically 2 (two) times daily. 30 g Lowella Petties R, NP   famotidine (PEPCID) 20 MG tablet Take 1 tablet (20 mg total) by mouth 2 (two) times daily. 30 tablet Hans Eden, NP      PDMP not reviewed this encounter.   Hans Eden, NP 01/25/23 1549

## 2023-01-25 NOTE — Discharge Instructions (Signed)
-  Rash in the neck appears to be a form of eczema which is an inflammatory process related to dry skin Begin prednisone every morning with food for 5 days to stop the inflammatory process and begin clear rash -  may use triamcinolone cream twice daily to spot treat the area until cleared after you have taken oral medication -Ensure that skin is moisturize well after washing, may use petroleum or Aquaphor or similar product -May follow-up with urgent care as needed  It is possible your abdominal pain is related to your acid reflux, low suspicion for an infectious cause due to timeline of your illness, unable to feel your liver do not think this is the cause of symptoms -Begin famotidine every morning and every evening for 14 days -While taking medication eating a blander diet to prevent further abdominal irritation - A primary care referral has been placed for you to help you find a doctor therefore if your symptoms continue to persist you may consider follow-up treatment and evaluation

## 2023-01-25 NOTE — ED Triage Notes (Signed)
Rash on neck that he noticed a week ago he thought was ring worm so tried a antifungal cream but didn't clear up the rash, and it has now spread to face and front of his neck. Patient also states he is having abdomin pain RLQ that started 3-4 months ago. States its a hard lump under his skin, states it feels tight and comes and goes not constant.

## 2023-04-10 ENCOUNTER — Encounter: Payer: Self-pay | Admitting: Nurse Practitioner

## 2023-04-10 ENCOUNTER — Ambulatory Visit: Payer: Self-pay | Admitting: Nurse Practitioner

## 2023-04-10 VITALS — BP 118/70 | HR 90 | Temp 97.8°F | Resp 16 | Ht 71.0 in | Wt 151.3 lb

## 2023-04-10 DIAGNOSIS — Z13220 Encounter for screening for lipoid disorders: Secondary | ICD-10-CM

## 2023-04-10 DIAGNOSIS — Z130 Encounter for screening for diseases of the blood and blood-forming organs and certain disorders involving the immune mechanism: Secondary | ICD-10-CM

## 2023-04-10 DIAGNOSIS — Z131 Encounter for screening for diabetes mellitus: Secondary | ICD-10-CM

## 2023-04-10 DIAGNOSIS — Z114 Encounter for screening for human immunodeficiency virus [HIV]: Secondary | ICD-10-CM

## 2023-04-10 DIAGNOSIS — Z7689 Persons encountering health services in other specified circumstances: Secondary | ICD-10-CM

## 2023-04-10 DIAGNOSIS — R1011 Right upper quadrant pain: Secondary | ICD-10-CM

## 2023-04-10 DIAGNOSIS — Z1159 Encounter for screening for other viral diseases: Secondary | ICD-10-CM

## 2023-04-10 NOTE — Progress Notes (Signed)
BP 118/70   Pulse 90   Temp 97.8 F (36.6 C)   Resp 16   Ht  (1.803 m)   Wt 151 lb 4.8 oz (68.6 kg)   SpO2 98%   BMI 21.10 kg/m    Subjective:    Patient ID: Jeff [name], male    DOB: [birth year], 27 y.o.   MRN: [id number]  HPI: Jeff [name] is a 27 y.o. male  Chief Complaint  Patient presents with   Establish Care   Abdominal Pain    RLQ pain flares/episodes   Establish care: his last physical was years ago.  Medical history includes none.  Family history includes diabetes,  heart problems, MI.  Health Maintenance labs.   RUQ abdominal pain: RUQ abdominal pain for close to a year. Pain comes and goes. Pain feels like a tightness, he says coughing can make the pain worse, nothing makes it feel better.  He says he notices the pain more at night.  He says it can be worse after physical activity.   He denies any nausea, vomiting, diarrhea, or constipation.  Getting labs and RUQ ultrasound.      04/10/2023    3:11 PM  Depression screen PHQ 2/9  Decreased Interest 0  Down, Depressed, Hopeless 0  PHQ - 2 Score 0  Altered sleeping 0  Tired, decreased energy 0  Change in appetite 0  Feeling bad or failure about yourself  0  Trouble concentrating 0  Moving slowly or fidgety/restless 0  Suicidal thoughts 0  PHQ-9 Score 0  Difficult doing work/chores Not difficult at all    Relevant past medical, surgical, family and social history reviewed and updated as indicated. Interim medical history since our last visit reviewed. Allergies and medications reviewed and updated.  Review of Systems Constitutional: Negative for fever or weight change.  Respiratory: Negative for cough and shortness of breath.   Cardiovascular: Negative for chest pain or palpitations.  Gastrointestinal: positive for abdominal pain, no bowel changes.  Musculoskeletal: Negative for gait problem or joint swelling.  Skin: Negative for rash.  Neurological: Negative for dizziness or headache.  No  other specific complaints in a complete review of systems (except as listed in HPI above).      Objective:    BP 118/70   Pulse 90   Temp 97.8 F (36.6 C)   Resp 16   Ht  (1.803 m)   Wt 151 lb 4.8 oz (68.6 kg)   SpO2 98%   BMI 21.10 kg/m   Wt Readings from Last 3 Encounters:  04/10/23 151 lb 4.8 oz (68.6 kg)  10/26/20 144 lb 8 oz (65.5 kg)  05/17/20 140 lb (63.5 kg)    Physical Exam  Constitutional: Patient appears well-developed and well-nourished. No distress.  HEENT: head atraumatic, normocephalic, pupils equal and reactive to light, neck supple Cardiovascular: Normal rate, regular rhythm and normal heart sounds.  No murmur heard. No BLE edema. Pulmonary/Chest: Effort normal and breath sounds normal. No respiratory distress. Abdominal: Soft.  Tenderness RUQ Psychiatric: Patient has a normal mood and affect. behavior is normal. Judgment and thought content normal.  Results for orders placed or performed in visit on 02/27/21  Gonococcus culture   Specimen: Throat   TH  Result Value Ref Range   GC Culture Only Final report    Result 1 Comment       Assessment & Plan:   Problem List Items Addressed This Visit   None Visit Diagnoses  Intermittent right upper quadrant abdominal pain    -  Primary   getting ultrasound and labs   Relevant Orders   CBC with Differential/Platelet   COMPLETE METABOLIC PANEL WITH GFR   US ABDOMEN LIMITED RUQ (LIVER/GB)   Screening for HIV without presence of risk factors       Relevant Orders   HIV antibody (with reflex)   Encounter for hepatitis C screening test for low risk patient       Relevant Orders   Hepatitis C Antibody   Screening for deficiency anemia       Relevant Orders   CBC with Differential/Platelet   Screening for diabetes mellitus       Relevant Orders   Hemoglobin A1c   Screening for cholesterol level       Relevant Orders   Lipid panel   Encounter to establish care       schedule cpe         Follow up plan: No follow-ups on file.

## 2023-04-11 LAB — CBC WITH DIFFERENTIAL/PLATELET
Absolute Monocytes: 319 cells/uL (ref 200–950)
Basophils Absolute: 52 cells/uL (ref 0–200)
Basophils Relative: 0.8 %
Eosinophils Absolute: 241 cells/uL (ref 15–500)
Eosinophils Relative: 3.7 %
HCT: 41.2 % (ref 38.5–50.0)
Hemoglobin: 13.7 g/dL (ref 13.2–17.1)
Lymphs Abs: 2015 cells/uL (ref 850–3900)
MCH: 30 pg (ref 27.0–33.0)
MCHC: 33.3 g/dL (ref 32.0–36.0)
MCV: 90.4 fL (ref 80.0–100.0)
MPV: 11 fL (ref 7.5–12.5)
Monocytes Relative: 4.9 %
Neutro Abs: 3874 cells/uL (ref 1500–7800)
Neutrophils Relative %: 59.6 %
Platelets: 248 10*3/uL (ref 140–400)
RBC: 4.56 10*6/uL (ref 4.20–5.80)
RDW: 13 % (ref 11.0–15.0)
Total Lymphocyte: 31 %
WBC: 6.5 10*3/uL (ref 3.8–10.8)

## 2023-04-11 LAB — COMPLETE METABOLIC PANEL WITH GFR
AG Ratio: 1.6 (calc) (ref 1.0–2.5)
ALT: 15 U/L (ref 9–46)
AST: 18 U/L (ref 10–40)
Albumin: 4.5 g/dL (ref 3.6–5.1)
Alkaline phosphatase (APISO): 89 U/L (ref 36–130)
BUN: 13 mg/dL (ref 7–25)
CO2: 29 mmol/L (ref 20–32)
Calcium: 9.7 mg/dL (ref 8.6–10.3)
Chloride: 101 mmol/L (ref 98–110)
Creat: 0.93 mg/dL (ref 0.60–1.24)
Globulin: 2.9 g/dL (calc) (ref 1.9–3.7)
Glucose, Bld: 188 mg/dL — ABNORMAL HIGH (ref 65–99)
Potassium: 4.6 mmol/L (ref 3.5–5.3)
Sodium: 137 mmol/L (ref 135–146)
Total Bilirubin: 0.3 mg/dL (ref 0.2–1.2)
Total Protein: 7.4 g/dL (ref 6.1–8.1)
eGFR: 115 mL/min/{1.73_m2} (ref >=60)

## 2023-04-11 LAB — LIPID PANEL
Cholesterol: 172 mg/dL (ref <200)
HDL: 49 mg/dL (ref >=40)
LDL Cholesterol (Calc): 96 mg/dL (calc)
Non-HDL Cholesterol (Calc): 123 mg/dL (calc) (ref <130)
Total CHOL/HDL Ratio: 3.5 (calc) (ref <5.0)
Triglycerides: 172 mg/dL — ABNORMAL HIGH (ref <150)

## 2023-04-11 LAB — HEMOGLOBIN A1C
Hgb A1c MFr Bld: 6.1 % of total Hgb — ABNORMAL HIGH (ref <5.7)
Mean Plasma Glucose: 128 mg/dL
eAG (mmol/L): 7.1 mmol/L

## 2023-04-11 LAB — HIV ANTIBODY (ROUTINE TESTING W REFLEX): HIV 1&2 Ab, 4th Generation: NONREACTIVE

## 2023-04-11 LAB — HEPATITIS C ANTIBODY: Hepatitis C Ab: NONREACTIVE

## 2023-04-15 NOTE — Progress Notes (Unsigned)
Name: Jeff [name]   MRN: [id number]    DOB: [birth year]   Date:04/16/2023       Progress Note  Subjective  Chief Complaint  Chief Complaint  Patient presents with   Annual Exam    HPI  Patient presents for annual CPE.  Diet: sushi, he tries to eat well balanced, does eat food at work Exercise: not really, physical job, recommend 150 min of a week Sleep: 8 hours, light sleeper Last dental exam:1 month ago Last eye exam: 2 years ago  Depression: phq 9 is negative    04/16/2023    3:32 PM 04/10/2023    3:11 PM  Depression screen PHQ 2/9  Decreased Interest 0 0  Down, Depressed, Hopeless 0 0  PHQ - 2 Score 0 0  Altered sleeping  0  Tired, decreased energy  0  Change in appetite  0  Feeling bad or failure about yourself   0  Trouble concentrating  0  Moving slowly or fidgety/restless  0  Suicidal thoughts  0  PHQ-9 Score  0  Difficult doing work/chores  Not difficult at all    Hypertension:  BP Readings from Last 3 Encounters:  04/16/23 118/72  04/10/23 118/70  01/25/23 (!) 137/96    Obesity: Wt Readings from Last 3 Encounters:  04/16/23 149 lb 9.6 oz (67.9 kg)  04/10/23 151 lb 4.8 oz (68.6 kg)  10/26/20 144 lb 8 oz (65.5 kg)   BMI Readings from Last 3 Encounters:  04/16/23 20.86 kg/m  04/10/23 21.10 kg/m  10/26/20 20.15 kg/m     Lipids:  Lab Results  Component Value Date   CHOL 172 04/10/2023   Lab Results  Component Value Date   HDL 49 04/10/2023   Lab Results  Component Value Date   LDLCALC 96 04/10/2023   Lab Results  Component Value Date   TRIG 172 (H) 04/10/2023   Lab Results  Component Value Date   CHOLHDL 3.5 04/10/2023   No results found for: "LDLDIRECT" Glucose:  Glucose, Bld  Date Value Ref Range Status  04/10/2023 188 (H) 65 - 99 mg/dL Final    Comment:    .            Fasting reference interval . For someone without known diabetes, a glucose value >125 mg/dL indicates that they may have diabetes and this should be  confirmed with a follow-up test. .   05/17/2020 123 (H) 70 - 99 mg/dL Final    Comment:    Glucose reference range applies only to samples taken after fasting for at least 8 hours.  11/07/2018 109 (H) 70 - 99 mg/dL Final    Flowsheet Row Office Visit from 04/16/2023 in [facility]  AUDIT-C Score 1        Single STD testing and prevention (HIV/chl/gon/syphilis): 04/10/2023 Hep C: 04/10/2023  Skin cancer: Discussed monitoring for atypical lesions Colorectal cancer: no concerns,does not qualify Prostate cancer: no concerns, does not qualify No results found for: "PSA"   Lung cancer:   Low Dose CT Chest recommended if Age 108-80 years, 30 pack-year currently smoking OR have quit w/in 15years. Patient does not qualify.   AAA:  The USPSTF recommends one-time screening with ultrasonography in men ages 23 to 78 years who have ever smoked ECG:  10/26/2020  Vaccines:  HPV: up to at age 72 , ask insurance if age between 24-45  Shingrix: 76-64 yo and ask insurance if covered when patient above 63 yo  Pneumonia:  educated and discussed with patient. Flu:  educated and discussed with patient.  Advanced Care Planning: A voluntary discussion about advance care planning including the explanation and discussion of advance directives.  Discussed health care proxy and Living will, and the patient was able to identify a health care proxy as mom.  Patient does not have a living will at present time. If patient does have living will, I have requested they bring this to the clinic to be scanned in to their chart.  Patient Active Problem List   Diagnosis Date Noted   Marijuana abuse 07/16/2018   Smoker 07/16/2018    Past Surgical History:  Procedure Laterality Date   dentures      Family History  Problem Relation Age of Onset   Hypertension Mother    Diabetes Father    Heart disease Father    Heart attack Paternal Uncle     Social History   Socioeconomic  History   Marital status: Single    Spouse name: Not on file   Number of children: Not on file   Years of education: Not on file   Highest education level: Not on file  Occupational History   Not on file  Tobacco Use   Smoking status: Former    Packs/day: .5    Types: Cigarettes   Smokeless tobacco: Never  Vaping Use   Vaping Use: Never used  Substance and Sexual Activity   Alcohol use: Yes    Comment: socially 2xmonth   Drug use: Yes    Frequency: 7.0 times per week    Types: Marijuana    Comment: daily    Sexual activity: Yes  Other Topics Concern   Not on file  Social History Narrative   Lives locally.  Works at [employer] in the mornings and [employer] in the afternoon/evenings.   Social Determinants of Health   Financial Resource Strain: Low Risk  (04/16/2023)   Overall Financial Resource Strain (CARDIA)    Difficulty of Paying Living Expenses: Not hard at all  Food Insecurity: No Food Insecurity (04/16/2023)   Hunger Vital Sign    Worried About Running Out of Food in the Last Year: Never true    Ran Out of Food in the Last Year: Never true  Transportation Needs: No Transportation Needs (04/16/2023)   PRAPARE - Administrator, Civil Service (Medical): No    Lack of Transportation (Non-Medical): No  Physical Activity: Inactive (04/16/2023)   Exercise Vital Sign    Days of Exercise per Week: 0 days    Minutes of Exercise per Session: 0 min  Stress: No Stress Concern Present (04/16/2023)   [employer] of Occupational Health - Occupational Stress Questionnaire    Feeling of Stress : Only a little  Social Connections: Socially Isolated (04/16/2023)   Social Connection and Isolation Panel [NHANES]    Frequency of Communication with Friends and Family: More than three times a week    Frequency of Social Gatherings with Friends and Family: More than three times a week    Attends Religious Services: Never    Database administrator or Organizations: No     Attends Banker Meetings: Never    Marital Status: Never married  Intimate Partner Violence: Not At Risk (04/16/2023)   Humiliation, Afraid, Rape, and Kick questionnaire    Fear of Current or Ex-Partner: No    Emotionally Abused: No    Physically Abused: No    Sexually Abused:  No     Current Outpatient Medications:    famotidine (PEPCID) 20 MG tablet, Take 1 tablet (20 mg total) by mouth 2 (two) times daily. (Patient taking differently: Take 20 mg by mouth as needed.), Disp: 30 tablet, Rfl: 0  No Known Allergies   ROS  Constitutional: Negative for fever or weight change.  Respiratory: Negative for cough and shortness of breath.   Cardiovascular: Negative for chest pain or palpitations.  Gastrointestinal: Negative for abdominal pain, no bowel changes.  Musculoskeletal: Negative for gait problem or joint swelling.  Skin: Negative for rash.  Neurological: Negative for dizziness or headache.  No other specific complaints in a complete review of systems (except as listed in HPI above).    Objective  Vitals:   04/16/23 1528  BP: 118/72  Pulse: 91  Resp: 18  Temp: 97.7 F (36.5 C)  TempSrc: Oral  SpO2: 99%  Weight: 149 lb 9.6 oz (67.9 kg)  Height: 5\' 11"  (1.803 m)    Body mass index is 20.86 kg/m.  Physical Exam Constitutional: Patient appears well-developed and well-nourished. No distress.  HENT: Head: Normocephalic and atraumatic. Ears: B TMs ok, no erythema or effusion; Nose: Nose normal. Mouth/Throat: Oropharynx is clear and moist. No oropharyngeal exudate.  Eyes: Conjunctivae and EOM are normal. Pupils are equal, round, and reactive to light. No scleral icterus.  Neck: Normal range of motion. Neck supple. No JVD present. No thyromegaly present.  Cardiovascular: Normal rate, regular rhythm and normal heart sounds.  No murmur heard. No BLE edema. Pulmonary/Chest: Effort normal and breath sounds normal. No respiratory distress. Abdominal: Soft. Bowel  sounds are normal, no distension. There is no tenderness. no masses Musculoskeletal: Normal range of motion, no joint effusions. No gross deformities Neurological: he is alert and oriented to person, place, and time. No cranial nerve deficit. Coordination, balance, strength, speech and gait are normal.  Skin: Skin is warm and dry. No rash noted. No erythema.  Psychiatric: Patient has a normal mood and affect. behavior is normal. Judgment and thought content normal.   Recent Results (from the past 2160 hour(s))  HIV antibody (with reflex)     Status: None   Collection Time: 04/10/23  3:41 PM  Result Value Ref Range   HIV 1&2 Ab, 4th Generation NON-REACTIVE NON-REACTIVE    Comment: HIV-1 antigen and HIV-1/HIV-2 antibodies were not detected. There is no laboratory evidence of HIV infection. Marland Kitchen PLEASE NOTE: This information has been disclosed to you from records whose confidentiality may be protected by state law.  If your state requires such protection, then the state law prohibits you from making any further disclosure of the information without the specific written consent of the person to whom it pertains, or as otherwise permitted by law. A general authorization for the release of medical or other information is NOT sufficient for this purpose. . For additional information please refer to http://education.[facility].com/faq/FAQ106 (This link is being provided for informational/ educational purposes only.) . Marland Kitchen The performance of this assay has not been clinically validated in patients less than 63 years old. .   Hepatitis C Antibody     Status: None   Collection Time: 04/10/23  3:41 PM  Result Value Ref Range   Hepatitis C Ab NON-REACTIVE NON-REACTIVE    Comment: . HCV antibody was non-reactive. There is no laboratory  evidence of HCV infection. . In most cases, no further action is required. However, if recent HCV exposure is suspected, a test for HCV RNA (test code  16109) is suggested. . For additional information please refer to http://education.[facility].com/faq/FAQ22v1 (This link is being provided for informational/ educational purposes only.) .   CBC with Differential/Platelet     Status: None   Collection Time: 04/10/23  3:41 PM  Result Value Ref Range   WBC 6.5 3.8 - 10.8 Thousand/uL   RBC 4.56 4.20 - 5.80 Million/uL   Hemoglobin 13.7 13.2 - 17.1 g/dL   HCT 60.4 54.0 - 98.1 %   MCV 90.4 80.0 - 100.0 fL   MCH 30.0 27.0 - 33.0 pg   MCHC 33.3 32.0 - 36.0 g/dL   RDW 19.1 47.8 - 29.5 %   Platelets 248 140 - 400 Thousand/uL   MPV 11.0 7.5 - 12.5 fL   Neutro Abs 3,874 1,500 - 7,800 cells/uL   Lymphs Abs 2,015 850 - 3,900 cells/uL   Absolute Monocytes 319 200 - 950 cells/uL   Eosinophils Absolute 241 15 - 500 cells/uL   Basophils Absolute 52 0 - 200 cells/uL   Neutrophils Relative % 59.6 %   Total Lymphocyte 31.0 %   Monocytes Relative 4.9 %   Eosinophils Relative 3.7 %   Basophils Relative 0.8 %  COMPLETE METABOLIC PANEL WITH GFR     Status: Abnormal   Collection Time: 04/10/23  3:41 PM  Result Value Ref Range   Glucose, Bld 188 (H) 65 - 99 mg/dL    Comment: .            Fasting reference interval . For someone without known diabetes, a glucose value >125 mg/dL indicates that they may have diabetes and this should be confirmed with a follow-up test. .    BUN 13 7 - 25 mg/dL   Creat 6.21 3.08 - 6.57 mg/dL   eGFR 846 > OR = 60 NG/EXB/2.84X3   BUN/Creatinine Ratio SEE NOTE: 6 - 22 (calc)    Comment:    Not Reported: BUN and Creatinine are within    reference range. .    Sodium 137 135 - 146 mmol/L   Potassium 4.6 3.5 - 5.3 mmol/L   Chloride 101 98 - 110 mmol/L   CO2 29 20 - 32 mmol/L   Calcium 9.7 8.6 - 10.3 mg/dL   Total Protein 7.4 6.1 - 8.1 g/dL   Albumin 4.5 3.6 - 5.1 g/dL   Globulin 2.9 1.9 - 3.7 g/dL (calc)   AG Ratio 1.6 1.0 - 2.5 (calc)   Total Bilirubin 0.3 0.2 - 1.2 mg/dL   Alkaline phosphatase (APISO)  89 36 - 130 U/L   AST 18 10 - 40 U/L   ALT 15 9 - 46 U/L  Lipid panel     Status: Abnormal   Collection Time: 04/10/23  3:41 PM  Result Value Ref Range   Cholesterol 172 <200 mg/dL   HDL 49 > OR = 40 mg/dL   Triglycerides 244 (H) <150 mg/dL   LDL Cholesterol (Calc) 96 mg/dL (calc)    Comment: Reference range: <100 . Desirable range <100 mg/dL for primary prevention;   <70 mg/dL for patients with CHD or diabetic patients  with > or = 2 CHD risk factors. Marland Kitchen LDL-C is now calculated using the Martin-Hopkins  calculation, which is a validated novel method providing  better accuracy than the Friedewald equation in the  estimation of LDL-C.  Horald Pollen et al. Lenox Ahr. 0102;725(36): 2061-2068  (http://education.[facility].com/faq/FAQ164)    Total CHOL/HDL Ratio 3.5 <5.0 (calc)   Non-HDL Cholesterol (Calc) 123 <130 mg/dL (calc)    Comment:  For patients with diabetes plus 1 major ASCVD risk  factor, treating to a non-HDL-C goal of <100 mg/dL  (LDL-C of <16 mg/dL) is considered a therapeutic  option.   Hemoglobin A1c     Status: Abnormal   Collection Time: 04/10/23  3:41 PM  Result Value Ref Range   Hgb A1c MFr Bld 6.1 (H) <5.7 % of total Hgb    Comment: For someone without known diabetes, a hemoglobin  A1c value between 5.7% and 6.4% is consistent with prediabetes and should be confirmed with a  follow-up test. . For someone with known diabetes, a value <7% indicates that their diabetes is well controlled. A1c targets should be individualized based on duration of diabetes, age, comorbid conditions, and other considerations. . This assay result is consistent with an increased risk of diabetes. . Currently, no consensus exists regarding use of hemoglobin A1c for diagnosis of diabetes for children. .    Mean Plasma Glucose 128 mg/dL   eAG (mmol/L) 7.1 mmol/L    Comment: . This test was performed on the Roche cobas c503 platform. Effective 10/01/22, a change in test  platforms from the Abbott Architect to the Roche cobas c503 may have shifted HbA1c results compared to historical results. Based on laboratory validation testing conducted at [facility], the Roche platform relative to the Abbott platform had an average increase in HbA1c value of < or = 0.3%. This difference is within accepted  variability established by the [facility] Note that not all individuals will have had a shift in their results and direct comparisons between historical and current results for testing conducted on different platforms is not recommended.      Fall Risk:    04/16/2023    3:32 PM 04/10/2023    3:11 PM  Fall Risk   Falls in the past year? 0 0  Number falls in past yr: 0 0  Injury with Fall? 0 0      Functional Status Survey: Is the patient deaf or have difficulty hearing?: No Does the patient have difficulty seeing, even when wearing glasses/contacts?: No Does the patient have difficulty concentrating, remembering, or making decisions?: No Does the patient have difficulty walking or climbing stairs?: No Does the patient have difficulty dressing or bathing?: No Does the patient have difficulty doing errands alone such as visiting a doctor's office or shopping?: No    Assessment & Plan  1. Annual physical exam Increase physical activity to 150 min a week Watch sugar intake   -Prostate cancer screening and PSA options (with potential risks and benefits of testing vs not testing) were discussed along with recent recs/guidelines. -USPSTF grade A and B recommendations reviewed with patient; age-appropriate recommendations, preventive care, screening tests, etc discussed and encouraged; healthy living encouraged; see AVS for patient education given to patient -Discussed importance of 150 minutes of physical activity weekly, eat two servings of fish weekly, eat one serving of tree nuts ( cashews, pistachios, pecans,  almonds.Marland Kitchen) every other day, eat 6 servings of fruit/vegetables daily and drink plenty of water and avoid sweet beverages.

## 2023-04-16 ENCOUNTER — Other Ambulatory Visit: Payer: Self-pay

## 2023-04-16 ENCOUNTER — Encounter: Payer: Self-pay | Admitting: Nurse Practitioner

## 2023-04-16 ENCOUNTER — Ambulatory Visit (INDEPENDENT_AMBULATORY_CARE_PROVIDER_SITE_OTHER): Payer: Self-pay | Admitting: Nurse Practitioner

## 2023-04-16 VITALS — BP 118/72 | HR 91 | Temp 97.7°F | Resp 18 | Ht 71.0 in | Wt 149.6 lb

## 2023-04-16 DIAGNOSIS — Z0000 Encounter for general adult medical examination without abnormal findings: Secondary | ICD-10-CM

## 2023-08-17 ENCOUNTER — Inpatient Hospital Stay: Payer: Self-pay

## 2023-08-17 ENCOUNTER — Other Ambulatory Visit: Payer: Self-pay

## 2023-08-17 ENCOUNTER — Inpatient Hospital Stay
Admission: EM | Admit: 2023-08-17 | Discharge: 2023-08-18 | DRG: 894 | Discharge status: Against Medical Advice | Payer: Self-pay | Attending: Internal Medicine | Admitting: Internal Medicine

## 2023-08-17 DIAGNOSIS — Z833 Family history of diabetes mellitus: Secondary | ICD-10-CM

## 2023-08-17 DIAGNOSIS — F17200 Nicotine dependence, unspecified, uncomplicated: Secondary | ICD-10-CM | POA: Diagnosis present

## 2023-08-17 DIAGNOSIS — F10930 Alcohol use, unspecified with withdrawal, uncomplicated: Principal | ICD-10-CM

## 2023-08-17 DIAGNOSIS — F1090 Alcohol use, unspecified, uncomplicated: Secondary | ICD-10-CM | POA: Insufficient documentation

## 2023-08-17 DIAGNOSIS — Z789 Other specified health status: Secondary | ICD-10-CM | POA: Insufficient documentation

## 2023-08-17 DIAGNOSIS — F10231 Alcohol dependence with withdrawal delirium: Principal | ICD-10-CM | POA: Diagnosis present

## 2023-08-17 DIAGNOSIS — R000 Tachycardia, unspecified: Secondary | ICD-10-CM | POA: Insufficient documentation

## 2023-08-17 DIAGNOSIS — D72829 Elevated white blood cell count, unspecified: Secondary | ICD-10-CM | POA: Insufficient documentation

## 2023-08-17 DIAGNOSIS — E860 Dehydration: Secondary | ICD-10-CM | POA: Diagnosis present

## 2023-08-17 DIAGNOSIS — Z5329 Procedure and treatment not carried out because of patient's decision for other reasons: Secondary | ICD-10-CM | POA: Diagnosis present

## 2023-08-17 DIAGNOSIS — Z716 Tobacco abuse counseling: Secondary | ICD-10-CM

## 2023-08-17 DIAGNOSIS — F17210 Nicotine dependence, cigarettes, uncomplicated: Secondary | ICD-10-CM | POA: Diagnosis present

## 2023-08-17 DIAGNOSIS — F1490 Cocaine use, unspecified, uncomplicated: Secondary | ICD-10-CM | POA: Insufficient documentation

## 2023-08-17 DIAGNOSIS — E871 Hypo-osmolality and hyponatremia: Secondary | ICD-10-CM | POA: Diagnosis present

## 2023-08-17 DIAGNOSIS — Z8249 Family history of ischemic heart disease and other diseases of the circulatory system: Secondary | ICD-10-CM

## 2023-08-17 DIAGNOSIS — R42 Dizziness and giddiness: Secondary | ICD-10-CM

## 2023-08-17 DIAGNOSIS — F10939 Alcohol use, unspecified with withdrawal, unspecified: Secondary | ICD-10-CM | POA: Diagnosis present

## 2023-08-17 DIAGNOSIS — F1210 Cannabis abuse, uncomplicated: Secondary | ICD-10-CM | POA: Diagnosis present

## 2023-08-17 LAB — URINE DRUG SCREEN, QUALITATIVE (ARMC ONLY)
Amphetamines, Ur Screen: POSITIVE — AB
Barbiturates, Ur Screen: NOT DETECTED
Benzodiazepine, Ur Scrn: NOT DETECTED
Cannabinoid 50 Ng, Ur ~~LOC~~: POSITIVE — AB
Cocaine Metabolite,Ur ~~LOC~~: POSITIVE — AB
MDMA (Ecstasy)Ur Screen: NOT DETECTED
Methadone Scn, Ur: NOT DETECTED
Opiate, Ur Screen: NOT DETECTED
Phencyclidine (PCP) Ur S: NOT DETECTED
Tricyclic, Ur Screen: NOT DETECTED

## 2023-08-17 LAB — HEPATIC FUNCTION PANEL
ALT: 13 U/L (ref 0–44)
AST: 29 U/L (ref 15–41)
Albumin: 4.7 g/dL (ref 3.5–5.0)
Alkaline Phosphatase: 102 U/L (ref 38–126)
Bilirubin, Direct: 0.2 mg/dL (ref 0.0–0.2)
Indirect Bilirubin: 1 mg/dL — ABNORMAL HIGH (ref 0.3–0.9)
Total Bilirubin: 1.2 mg/dL (ref 0.3–1.2)
Total Protein: 8.4 g/dL — ABNORMAL HIGH (ref 6.5–8.1)

## 2023-08-17 LAB — URINALYSIS, ROUTINE W REFLEX MICROSCOPIC
Bilirubin Urine: NEGATIVE
Glucose, UA: NEGATIVE mg/dL
Hgb urine dipstick: NEGATIVE
Ketones, ur: 20 mg/dL — AB
Leukocytes,Ua: NEGATIVE
Nitrite: NEGATIVE
Protein, ur: 30 mg/dL — AB
Specific Gravity, Urine: 1.019 (ref 1.005–1.030)
pH: 7 (ref 5.0–8.0)

## 2023-08-17 LAB — CBC
HCT: 48.7 % (ref 39.0–52.0)
Hemoglobin: 17 g/dL (ref 13.0–17.0)
MCH: 31.7 pg (ref 26.0–34.0)
MCHC: 34.9 g/dL (ref 30.0–36.0)
MCV: 90.7 fL (ref 80.0–100.0)
Platelets: 363 10*3/uL (ref 150–400)
RBC: 5.37 MIL/uL (ref 4.22–5.81)
RDW: 12.1 % (ref 11.5–15.5)
WBC: 11.6 10*3/uL — ABNORMAL HIGH (ref 4.0–10.5)
nRBC: 0 % (ref 0.0–0.2)

## 2023-08-17 LAB — BASIC METABOLIC PANEL
Anion gap: 11 (ref 5–15)
BUN: 9 mg/dL (ref 6–20)
CO2: 26 mmol/L (ref 22–32)
Calcium: 9.8 mg/dL (ref 8.9–10.3)
Chloride: 99 mmol/L (ref 98–111)
Creatinine, Ser: 0.89 mg/dL (ref 0.61–1.24)
GFR, Estimated: >60 mL/min (ref >60)
Glucose, Bld: 168 mg/dL — ABNORMAL HIGH (ref 70–99)
Potassium: 3.9 mmol/L (ref 3.5–5.1)
Sodium: 136 mmol/L (ref 135–145)

## 2023-08-17 LAB — MAGNESIUM: Magnesium: 2 mg/dL (ref 1.7–2.4)

## 2023-08-17 LAB — TROPONIN I (HIGH SENSITIVITY)
Troponin I (High Sensitivity): 3 ng/L (ref <18)
Troponin I (High Sensitivity): 4 ng/L (ref <18)

## 2023-08-17 LAB — ETHANOL: Alcohol, Ethyl (B): <10 mg/dL (ref <10)

## 2023-08-17 MED ORDER — THIAMINE MONONITRATE 100 MG PO TABS
100.0000 mg | ORAL_TABLET | Freq: Every day | ORAL | Status: DC
  Administered 2023-08-17 – 2023-08-18 (×2): 100 mg via ORAL
  Filled 2023-08-17 (×2): qty 1

## 2023-08-17 MED ORDER — THIAMINE HCL 100 MG/ML IJ SOLN: 100.0000 mg | Freq: Every day | INTRAMUSCULAR | Status: DC

## 2023-08-17 MED ORDER — LORAZEPAM 2 MG/ML IJ SOLN
1.0000 mg | Freq: Once | INTRAMUSCULAR | Status: AC
  Administered 2023-08-17: 1 mg via INTRAVENOUS
  Filled 2023-08-17: qty 1

## 2023-08-17 MED ORDER — LACTATED RINGERS IV SOLN: INTRAVENOUS | Status: DC

## 2023-08-17 MED ORDER — LORAZEPAM 2 MG/ML IJ SOLN: 1.0000 mg | INTRAMUSCULAR | Status: DC | PRN

## 2023-08-17 MED ORDER — NICOTINE 21 MG/24HR TD PT24: 21.0000 mg | MEDICATED_PATCH | Freq: Every day | TRANSDERMAL | Status: DC | PRN

## 2023-08-17 MED ORDER — SODIUM CHLORIDE 0.9 % IV BOLUS
1000.0000 mL | Freq: Once | INTRAVENOUS | Status: AC
  Administered 2023-08-17: 1000 mL via INTRAVENOUS

## 2023-08-17 MED ORDER — ENOXAPARIN SODIUM 40 MG/0.4ML IJ SOSY: 40.0000 mg | PREFILLED_SYRINGE | INTRAMUSCULAR | Status: DC

## 2023-08-17 MED ORDER — ACETAMINOPHEN 325 MG PO TABS: 650.0000 mg | ORAL_TABLET | Freq: Four times a day (QID) | ORAL | Status: DC | PRN

## 2023-08-17 MED ORDER — ACETAMINOPHEN 650 MG RE SUPP: 650.0000 mg | Freq: Four times a day (QID) | RECTAL | Status: DC | PRN

## 2023-08-17 MED ORDER — FOLIC ACID 1 MG PO TABS
1.0000 mg | ORAL_TABLET | Freq: Every day | ORAL | Status: DC
  Administered 2023-08-17 – 2023-08-18 (×2): 1 mg via ORAL
  Filled 2023-08-17 (×2): qty 1

## 2023-08-17 MED ORDER — METOPROLOL TARTRATE 5 MG/5ML IV SOLN: 5.0000 mg | INTRAVENOUS | Status: AC | PRN

## 2023-08-17 MED ORDER — FAMOTIDINE 20 MG PO TABS
20.0000 mg | ORAL_TABLET | Freq: Two times a day (BID) | ORAL | Status: DC
  Administered 2023-08-17 – 2023-08-18 (×2): 20 mg via ORAL
  Filled 2023-08-17 (×2): qty 1

## 2023-08-17 MED ORDER — ADULT MULTIVITAMIN W/MINERALS CH
1.0000 | ORAL_TABLET | Freq: Every day | ORAL | Status: DC
  Administered 2023-08-17 – 2023-08-18 (×2): 1 via ORAL
  Filled 2023-08-17 (×2): qty 1

## 2023-08-17 MED ORDER — ONDANSETRON HCL 4 MG/2ML IJ SOLN: 4.0000 mg | Freq: Four times a day (QID) | INTRAMUSCULAR | Status: DC | PRN

## 2023-08-17 MED ORDER — ONDANSETRON HCL 4 MG PO TABS: 4.0000 mg | ORAL_TABLET | Freq: Four times a day (QID) | ORAL | Status: DC | PRN

## 2023-08-17 MED ORDER — SENNOSIDES-DOCUSATE SODIUM 8.6-50 MG PO TABS: 1.0000 | ORAL_TABLET | Freq: Every evening | ORAL | Status: DC | PRN

## 2023-08-17 MED ORDER — LACTATED RINGERS IV BOLUS: 1000.0000 mL | Freq: Once | INTRAVENOUS | Status: DC

## 2023-08-17 MED ORDER — LORAZEPAM 1 MG PO TABS: 1.0000 mg | ORAL_TABLET | ORAL | Status: DC | PRN

## 2023-08-17 NOTE — H&P (Addendum)
History and Physical   SAXTON SWIERK ZOX:[id number] DOB: 09-Jun-1996 DOA: 08/17/2023  PCP: Berniece Salines, FNP  Patient coming from: Work via POV  I have personally briefly reviewed patient's old medical records in [facility] EMR.  Chief Concern: Dizziness, weakness  HPI: Jeff [name] is a 27 year old male with history of alcohol use, cocaine use, who presents to the emergency department for chief concerns of dizziness at work.  Vitals in the ED showed temperature of 98, respiration rate of 22, heart rate of 150, blood pressure 149/111, SpO2 of 98% on room air.  Serum sodium is 136, potassium 3.9, chloride 99, bicarb 26, BUN of 9, serum creatinine of 0.89, eGFR greater than 60, nonfasting blood glucose 168.  WBC 11.6, hemoglobin 17, platelets of 363. High sensitive troponin is 3.  ED treatment: Ativan 1 mg IV one-time dose, sodium chloride 1 L bolus ----------------------------- At bedside, patient patient is able to tell me his name, age, location, current calendar year.  Patient was on his mobile device when I entered the room.  He does not appear to be in acute distress.  He was talking on the phone.  He reports that today while he was at work, he felt dizzy, weak like he was going to pass out.  He denies passing out.  He denies loss of consciousness.  He denies chest pressure, shortness of breath, dysuria, hematuria, diarrhea, swelling of his lower extremities.  He reports he has had symptoms like this before in the past in 2021 and was worked up and was found to have negative cardiac issues.  Reports that over the last 2 to 3 months he has been drinking 10 shots per day and he has started using cocaine intranasally.  He also smokes marijuana.  Social history: He lives at home with his mother and sister.  He has also started using tobacco products smoking cigarettes.  Daily alcohol use, 10 shots per day.  He works as a Leisure centre manager.  And he uses cocaine.  ROS: Constitutional:  no weight change, no fever ENT/Mouth: no sore throat, no rhinorrhea Eyes: no eye pain, no vision changes Cardiovascular: no chest pain, no dyspnea,  no edema, no palpitations Respiratory: no cough, no sputum, no wheezing Gastrointestinal: no nausea, no vomiting, no diarrhea, no constipation Genitourinary: no urinary incontinence, no dysuria, no hematuria Musculoskeletal: no arthralgias, no myalgias Skin: no skin lesions, no pruritus, Neuro: + weakness, no loss of consciousness, no syncope Psych: no anxiety, no depression, + decrease appetite Heme/Lymph: no bruising, no bleeding  ED Course: Discussed with emergency medicine provider, patient requiring hospitalization for chief concerns of dehydration and alcohol withdrawal.  Assessment/Plan  Principal Problem:   Alcohol withdrawal with inpatient treatment [facility]) Active Problems:   Smoker   Alcohol use   Leukocytosis   Sinus tachycardia   Cocaine use   Assessment and Plan:  * Alcohol withdrawal with inpatient treatment (HCC) CIWA precaution Admit to telemetry cardiac, inpatient  Cocaine use Counseling given High sensitive troponin was negative  Sinus tachycardia Presumed multifactorial in setting of history of sinus tachycardia and alcohol withdrawal and dehydration Patient reports he has history of sinus tachycardia and has been evaluated in 2021 for the same He reports his symptoms are unchanged from 2021 where he had a complete echo done Status post sodium chloride 1 L bolus Lactated Ringer's 125 mL/h, 1 day ordered  Leukocytosis Suspect secondary to hemoconcentration in setting of dehydration Status post sodium chloride 1 L bolus Will continue with LR  infusion at 125 mL/h, 1 day ordered BMP in the a.m.  Alcohol use Extensive counseling given at bedside CIWA precaution  Chart reviewed.   11/15/2020: Complete echo estimated ejection fraction read as 60 to 65%.  Right ventricular systolic function is  normal.  DVT prophylaxis: Enoxaparin 40 mg subcutaneous every 24 hours Code Status: Full code Diet: Regular diet Family Communication: A phone call was offered, patient declined stating that he will let his mother know. Disposition Plan: Pending clinical course Consults called: None at this time Admission status: Telemetry cardiac, inpatient  Past Medical History:  Diagnosis Date   Marijuana abuse    Palpitations    Tobacco abuse    Past Surgical History:  Procedure Laterality Date   dentures     Social History:  reports that he has been smoking cigarettes. He has never used smokeless tobacco. He reports current alcohol use. He reports current drug use. Frequency: 7.00 times per week. Drugs: Marijuana and Cocaine.  No Known Allergies Family History  Problem Relation Age of Onset   Hypertension Mother    Diabetes Father    Heart disease Father    Heart attack Paternal Uncle    Family history: Family history reviewed and not pertinent.  Prior to Admission medications   Medication Sig Start Date End Date Taking? Authorizing Provider  famotidine (PEPCID) 20 MG tablet Take 1 tablet (20 mg total) by mouth 2 (two) times daily. Patient taking differently: Take 20 mg by mouth as needed. 01/25/23   Valinda Hoar, NP   Physical Exam: Vitals:   08/17/23 1730 08/17/23 1800 08/17/23 1819 08/17/23 1830  BP: (!) 147/103 (!) 146/100  (!) 147/99  Pulse: 93 94  (!) 106  Resp: 18 (!) 27  19  Temp:      TempSrc:      SpO2: 100% 99%  100%  Weight:   59.9 kg   Height:       Constitutional: appears age-appropriate, NAD, calm Eyes: PERRL, lids and conjunctivae normal ENMT: Mucous membranes are moist. Posterior pharynx clear of any exudate or lesions. Age-appropriate dentition. Hearing appropriate Neck: normal, supple, no masses, no thyromegaly Respiratory: clear to auscultation bilaterally, no wheezing, no crackles. Normal respiratory effort. No accessory muscle use.  Cardiovascular:  Sinus tachycardia, regular rhythm rhythm, no murmurs / rubs / gallops. No extremity edema. 2+ pedal pulses. No carotid bruits.  Abdomen: no tenderness, no masses palpated, no hepatosplenomegaly. Bowel sounds positive.  Musculoskeletal: no clubbing / cyanosis. No joint deformity upper and lower extremities. Good ROM, no contractures, no atrophy. Normal muscle tone.  Skin: no rashes, lesions, ulcers. No induration.  Old tattoo on his right neck appears well-healed Neurologic: Sensation intact. Strength 5/5 in all 4.  Psychiatric: Normal judgment and insight. Alert and oriented x 3. Normal mood.   EKG: independently reviewed, showing sinus tachycardia with rate of 142, QTc 393  Chest x-ray on Admission: I personally reviewed and I agree with radiologist reading as below.  DG Chest Port 1 View  Result Date: 08/17/2023 CLINICAL DATA:  Near syncope. History of alcohol and cocaine use. Dizziness at work. EXAM: PORTABLE CHEST 1 VIEW COMPARISON:  Chest radiograph dated 11/07/2018. FINDINGS: No focal consolidation, pleural effusion or pneumothorax. Small left suprahilar probable granuloma. The cardiac silhouette is within normal limits. No acute osseous pathology. IMPRESSION: No active disease. Electronically Signed   By: Elgie Collard M.D.   On: 08/17/2023 19:02    Labs on Admission: I have personally reviewed following labs  CBC: Recent Labs  Lab 08/17/23 1623  WBC 11.6*  HGB 17.0  HCT 48.7  MCV 90.7  PLT 363   Basic Metabolic Panel: Recent Labs  Lab 08/17/23 1623  NA 136  K 3.9  CL 99  CO2 26  GLUCOSE 168*  BUN 9  CREATININE 0.89  CALCIUM 9.8  MG 2.0   GFR: Estimated Creatinine Clearance: 104.7 mL/min (by C-G formula based on SCr of 0.89 mg/dL).  Liver Function Tests: Recent Labs  Lab 08/17/23 1623  AST 29  ALT 13  ALKPHOS 102  BILITOT 1.2  PROT 8.4*  ALBUMIN 4.7   Urine analysis:    Component Value Date/Time   COLORURINE YELLOW (A) 05/17/2020 1736    APPEARANCEUR CLOUDY (A) 05/17/2020 1736   LABSPEC 1.023 05/17/2020 1736   PHURINE 6.0 05/17/2020 1736   GLUCOSEU NEGATIVE 05/17/2020 1736   HGBUR MODERATE (A) 05/17/2020 1736   BILIRUBINUR NEGATIVE 05/17/2020 1736   KETONESUR NEGATIVE 05/17/2020 1736   PROTEINUR NEGATIVE 05/17/2020 1736   NITRITE NEGATIVE 05/17/2020 1736   LEUKOCYTESUR NEGATIVE 05/17/2020 1736   This document was prepared using Dragon Voice Recognition software and may include unintentional dictation errors.  Dr. Sedalia Muta [facility]  If 7PM-7AM, please contact overnight-coverage provider If 7AM-7PM, please contact day attending provider [email or web address]  08/17/2023, 7:10 PM

## 2023-08-17 NOTE — Hospital Course (Addendum)
Jeff [name] is a 27 year old male with history of alcohol use, cocaine use, who presents to the emergency department for chief concerns of dizziness at work.  Vitals in the ED showed temperature of 98, respiration rate of 22, heart rate of 150, blood pressure 149/111, SpO2 of 98% on room air.  Serum sodium is 136, potassium 3.9, chloride 99, bicarb 26, BUN of 9, serum creatinine of 0.89, eGFR greater than 60, nonfasting blood glucose 168.  WBC 11.6, hemoglobin 17, platelets of 363. High sensitive troponin is 3.  ED treatment: Ativan 1 mg IV one-time dose, sodium chloride 1 L bolus

## 2023-08-17 NOTE — Assessment & Plan Note (Signed)
Counseling given High sensitive troponin was negative

## 2023-08-17 NOTE — ED Triage Notes (Signed)
Pt to ed from work via POV for dizziness. Pt was at work when he became very dizzy and felt like he was going to pass out. Pt is CAOx4 and in no acute distress and ambulatory in triage. Pt does admitt to drinking ETOH. Pt drinks liquor daily, over 10 shots a day. Last drink was last night. Pt is very trembly in triage.

## 2023-08-17 NOTE — Progress Notes (Signed)
       CROSS COVER NOTE  NAME: Jeff [name] MRN: [id number] DOB : 09-28-1996    Concern as stated by nurse / staff    Per conversation with admitting physician, patient does not need progressive 2A bed    Pertinent findings on chart review: Chart reviewed  Assessment and  Interventions   Assessment:  Plan: Admit bed change to med tele       Donnie Mesa NP [facility] Cross Cover 7pm-7am - check amion for availability Pager [phone]

## 2023-08-17 NOTE — Assessment & Plan Note (Signed)
Suspect secondary to hemoconcentration in setting of dehydration Status post sodium chloride 1 L bolus Will continue with LR infusion at 125 mL/h, 1 day ordered BMP in the a.m.

## 2023-08-17 NOTE — Assessment & Plan Note (Signed)
Extensive counseling given at bedside CIWA precaution

## 2023-08-17 NOTE — Assessment & Plan Note (Signed)
CIWA precaution Admit to telemetry cardiac, inpatient

## 2023-08-17 NOTE — Assessment & Plan Note (Addendum)
Presumed multifactorial in setting of history of sinus tachycardia and alcohol withdrawal and dehydration Patient reports he has history of sinus tachycardia and has been evaluated in 2021 for the same He reports his symptoms are unchanged from 2021 where he had a complete echo done Status post sodium chloride 1 L bolus Lactated Ringer's 125 mL/h, 1 day ordered

## 2023-08-17 NOTE — ED Provider Notes (Signed)
[facility] Provider Note    Event Date/Time   First MD Initiated Contact with Patient 08/17/23 1647     (approximate)   History   Chief Complaint Dizziness and Delirium Tremens (DTS)   HPI  Jeff [name] is a 27 y.o. male with no significant past medical history who presents to the ED complaining of dizziness.  Patient reports that he was at work just prior to arrival when he suddenly started feeling dizzy and lightheaded with diffuse shaking of his body.  He states he feels like he is going to pass out but denies any associated chest pain or shortness of breath.  He states that he feels very shaky and unsteady, does admit that he has not had much to eat over the past couple of days due to poor appetite.  He admits to regular alcohol consumption, at least 10 liquor drinks nightly.  His last alcohol consumption was last night and he denies any history of alcohol withdrawal, does admit to cocaine use last night as well but denies any IV drug use.     Physical Exam   Triage Vital Signs: ED Triage Vitals [08/17/23 1619]  Encounter Vitals Group     BP (!) 149/111     Systolic BP Percentile      Diastolic BP Percentile      Pulse Rate (!) 150     Resp (!) 22     Temp 98 F (36.7 C)     Temp Source Oral     SpO2 98 %     Weight      Height 6' (1.829 m)     Head Circumference      Peak Flow      Pain Score 0     Pain Loc      Pain Education      Exclude from Growth Chart     Most recent vital signs: Vitals:   08/17/23 1619  BP: (!) 149/111  Pulse: (!) 150  Resp: (!) 22  Temp: 98 F (36.7 C)  SpO2: 98%    Constitutional: Alert and oriented. Eyes: Conjunctivae are normal. Head: Atraumatic. Nose: No congestion/rhinnorhea. Mouth/Throat: Mucous membranes are dry Cardiovascular: Tachycardic, regular rhythm. Grossly normal heart sounds.  2+ radial pulses bilaterally. Respiratory: Normal respiratory effort.  No retractions. Lungs  CTAB. Gastrointestinal: Soft and nontender. No distention. Musculoskeletal: No lower extremity tenderness nor edema.  Neurologic:  Normal speech and language. No gross focal neurologic deficits are appreciated.  Diffuse tremors with tongue fasciculations noted.    ED Results / Procedures / Treatments   Labs (all labs ordered are listed, but only abnormal results are displayed) Labs Reviewed  BASIC METABOLIC PANEL - Abnormal; Notable for the following components:      Result Value   Glucose, Bld 168 (*)    All other components within normal limits  CBC - Abnormal; Notable for the following components:   WBC 11.6 (*)    All other components within normal limits  HEPATIC FUNCTION PANEL - Abnormal; Notable for the following components:   Total Protein 8.4 (*)    Indirect Bilirubin 1.0 (*)    All other components within normal limits  ETHANOL  MAGNESIUM  URINALYSIS, ROUTINE W REFLEX MICROSCOPIC  URINE DRUG SCREEN, QUALITATIVE ([facility] ONLY)  CBG MONITORING, ED  TROPONIN I (HIGH SENSITIVITY)  TROPONIN I (HIGH SENSITIVITY)     EKG  ED ECG REPORT I, Chesley Noon, the attending physician, personally viewed and  interpreted this ECG.   Date: 08/17/2023  EKG Time: 16:24  Rate: 142  Rhythm: sinus tachycardia  Axis: Normal  Intervals:none  ST&T Change: None  PROCEDURES:  Critical Care performed: Yes, see critical care procedure note(s)  .Critical Care  Performed by: Chesley Noon, MD Authorized by: Chesley Noon, MD   Critical care provider statement:    Critical care time (minutes):  30   Critical care was necessary to treat or prevent imminent or life-threatening deterioration of the following conditions:  Toxidrome (Alcohol withdrawal)   Critical care was time spent personally by me on the following activities:  Development of treatment plan with patient or surrogate, discussions with consultants, evaluation of patient's response to treatment, examination of patient,  ordering and review of laboratory studies, ordering and review of radiographic studies, ordering and performing treatments and interventions, pulse oximetry, re-evaluation of patient's condition and review of old charts   I assumed direction of critical care for this patient from another provider in my specialty: no     Care discussed with: admitting provider      MEDICATIONS ORDERED IN ED: Medications  LORazepam (ATIVAN) injection 1 mg (has no administration in time range)  LORazepam (ATIVAN) injection 1 mg (1 mg Intravenous Given 08/17/23 1725)  sodium chloride 0.9 % bolus 1,000 mL (1,000 mLs Intravenous New Bag/Given 08/17/23 1725)     IMPRESSION / MDM / ASSESSMENT AND PLAN / ED COURSE  I reviewed the triage vital signs and the nursing notes.                              27 y.o. male with no significant past medical history who presents to the ED complaining of diffuse tremors, dizziness, and lightheadedness starting earlier this afternoon, admits to regular alcohol consumption with his last drink last night.  Patient's presentation is most consistent with acute presentation with potential threat to life or bodily function.  Differential diagnosis includes, but is not limited to, alcohol withdrawal, delirium tremens, electrolyte abnormality, AKI, anemia, arrhythmia, dehydration.  Patient ill-appearing, tachycardic with elevated blood pressure.  EKG shows sinus tachycardia and patient has diffuse tremor with tongue fasciculations concerning for alcohol withdrawal.  There may also be a component of dehydration or electrolyte abnormality given his poor p.o. intake.  We will hydrate with IV fluids and give dose of IV Ativan, lab results are pending at this time.  Lab results are reassuring with no significant anemia, leukocytosis, tract abnormality, or AKI.  LFTs are unremarkable and magnesium level within normal limits.  Troponin also within normal limits and I doubt cardiac etiology.   Patient feeling better following IV fluids and dose of IV Ativan, however he continues to have tachycardia, hypertension, and tremors concerning for significant alcohol withdrawal.  We will give additional dose of IV Ativan and case discussed with hospitalist for admission.      FINAL CLINICAL IMPRESSION(S) / ED DIAGNOSES   Final diagnoses:  Alcohol withdrawal syndrome without complication (HCC)  Lightheadedness     Rx / DC Orders   ED Discharge Orders     None        Note:  This document was prepared using Dragon voice recognition software and may include unintentional dictation errors.   Chesley Noon, MD 08/17/23 [phone]

## 2023-08-18 DIAGNOSIS — F10930 Alcohol use, unspecified with withdrawal, uncomplicated: Secondary | ICD-10-CM

## 2023-08-18 LAB — CBC
HCT: 39.4 % (ref 39.0–52.0)
Hemoglobin: 13.6 g/dL (ref 13.0–17.0)
MCH: 31.6 pg (ref 26.0–34.0)
MCHC: 34.5 g/dL (ref 30.0–36.0)
MCV: 91.4 fL (ref 80.0–100.0)
Platelets: 270 10*3/uL (ref 150–400)
RBC: 4.31 MIL/uL (ref 4.22–5.81)
RDW: 12.1 % (ref 11.5–15.5)
WBC: 9.7 10*3/uL (ref 4.0–10.5)
nRBC: 0 % (ref 0.0–0.2)

## 2023-08-18 LAB — BASIC METABOLIC PANEL
Anion gap: 8 (ref 5–15)
BUN: 8 mg/dL (ref 6–20)
CO2: 25 mmol/L (ref 22–32)
Calcium: 8.8 mg/dL — ABNORMAL LOW (ref 8.9–10.3)
Chloride: 101 mmol/L (ref 98–111)
Creatinine, Ser: 0.77 mg/dL (ref 0.61–1.24)
GFR, Estimated: >60 mL/min (ref >60)
Glucose, Bld: 104 mg/dL — ABNORMAL HIGH (ref 70–99)
Potassium: 3.5 mmol/L (ref 3.5–5.1)
Sodium: 134 mmol/L — ABNORMAL LOW (ref 135–145)

## 2023-08-18 NOTE — Progress Notes (Signed)
PROGRESS NOTE    Jeff [name]  ZOX:[id number] DOB: Nov 10, 1996 DOA: 08/17/2023 PCP: Berniece Salines, FNP   Assessment & Plan:   Principal Problem:   Alcohol withdrawal with inpatient treatment [facility]) Active Problems:   Smoker   Alcohol use   Leukocytosis   Sinus tachycardia   Cocaine use   Alcohol withdrawal (HCC)  Assessment and Plan: Alcohol withdrawal: continue on CIWA protocol. Received alcohol cessation counseling already   Illicit drug use: urine drug screen was positive for cocaine, amphetamines, marijuana. Received cessation counseling already    Sinus tachycardia: likely secondary to dehydration & alcohol w/drawal. Continue on IVFs.    Leukocytosis: resolved   Smoker: received cessation counseling x 5 min. Pt refused a nicotine patch   Hyponatremia: will likely improve w/ IVFs     DVT prophylaxis: lovenox Code Status: full  Family Communication:  Disposition Plan: likely will d/c back home  Level of care: Telemetry Medical Status is: Inpatient Remains inpatient appropriate because: severity of illness    Consultants:    Procedures:  Antimicrobials:    Subjective: Pt c/o intermittent nausea & vomiting   Objective: Vitals:   08/17/23 2200 08/17/23 2315 08/17/23 2326 08/18/23 0401  BP: 113/67 (!) 143/103 137/88 122/88  Pulse: 85 98  74  Resp:  16  16  Temp:  98.3 F (36.8 C)  98.1 F (36.7 C)  TempSrc:  Oral  Oral  SpO2:  96%  100%  Weight:  63 kg    Height:  6' (1.829 m)      Intake/Output Summary (Last 24 hours) at 08/18/2023 0806 Last data filed at 08/18/2023 0600 Gross per 24 hour  Intake 2437.7 ml  Output 0 ml  Net 2437.7 ml   Filed Weights   08/17/23 1819 08/17/23 2315  Weight: 59.9 kg 63 kg    Examination:  General exam: Appears calm and comfortable  Respiratory system: Clear to auscultation. Respiratory effort normal. Cardiovascular system: S1 & S2 +. No rubs, gallops or clicks. Gastrointestinal system: Abdomen is  nondistended, soft and nontender. Normal bowel sounds heard. Central nervous system: Alert and oriented. Moves all extremities  Psychiatry: Judgement and insight appear normal. Mood & affect appropriate.     Data Reviewed: I have personally reviewed following labs and imaging studies  CBC: Recent Labs  Lab 08/17/23 1623 08/18/23 0343  WBC 11.6* 9.7  HGB 17.0 13.6  HCT 48.7 39.4  MCV 90.7 91.4  PLT 363 270   Basic Metabolic Panel: Recent Labs  Lab 08/17/23 1623 08/18/23 0343  NA 136 134*  K 3.9 3.5  CL 99 101  CO2 26 25  GLUCOSE 168* 104*  BUN 9 8  CREATININE 0.89 0.77  CALCIUM 9.8 8.8*  MG 2.0  --    GFR: Estimated Creatinine Clearance: 122.5 mL/min (by C-G formula based on SCr of 0.77 mg/dL). Liver Function Tests: Recent Labs  Lab 08/17/23 1623  AST 29  ALT 13  ALKPHOS 102  BILITOT 1.2  PROT 8.4*  ALBUMIN 4.7   No results for input(s): "LIPASE", "AMYLASE" in the last 168 hours. No results for input(s): "AMMONIA" in the last 168 hours. Coagulation Profile: No results for input(s): "INR", "PROTIME" in the last 168 hours. Cardiac Enzymes: No results for input(s): "CKTOTAL", "CKMB", "CKMBINDEX", "TROPONINI" in the last 168 hours. BNP (last 3 results) No results for input(s): "PROBNP" in the last 8760 hours. HbA1C: No results for input(s): "HGBA1C" in the last 72 hours. CBG: No results for input(s): "GLUCAP"  in the last 168 hours. Lipid Profile: No results for input(s): "CHOL", "HDL", "LDLCALC", "TRIG", "CHOLHDL", "LDLDIRECT" in the last 72 hours. Thyroid Function Tests: No results for input(s): "TSH", "T4TOTAL", "FREET4", "T3FREE", "THYROIDAB" in the last 72 hours. Anemia Panel: No results for input(s): "VITAMINB12", "FOLATE", "FERRITIN", "TIBC", "IRON", "RETICCTPCT" in the last 72 hours. Sepsis Labs: No results for input(s): "PROCALCITON", "LATICACIDVEN" in the last 168 hours.  No results found for this or any previous visit (from the past 240  hour(s)).       Radiology Studies: DG Chest Port 1 View  Result Date: 08/17/2023 CLINICAL DATA:  Near syncope. History of alcohol and cocaine use. Dizziness at work. EXAM: PORTABLE CHEST 1 VIEW COMPARISON:  Chest radiograph dated 11/07/2018. FINDINGS: No focal consolidation, pleural effusion or pneumothorax. Small left suprahilar probable granuloma. The cardiac silhouette is within normal limits. No acute osseous pathology. IMPRESSION: No active disease. Electronically Signed   By: Elgie Collard M.D.   On: 08/17/2023 19:02        Scheduled Meds:  enoxaparin (LOVENOX) injection  40 mg Subcutaneous Q24H   famotidine  20 mg Oral BID   folic acid  1 mg Oral Daily   multivitamin with minerals  1 tablet Oral Daily   thiamine  100 mg Oral Daily   Or   thiamine  100 mg Intravenous Daily   Continuous Infusions:  lactated ringers 125 mL/hr at 08/17/23 2339     LOS: 1 day    Time spent: 35 mins     Charise Killian, MD [facility] Pager [phone]  If 7PM-7AM, please contact night-coverage [email or web address] 08/18/2023, 8:06 AM

## 2023-08-18 NOTE — Progress Notes (Signed)
Patient states he would like to leave against medical advice. Patient educated on risk of leaving AMA, verbalized understanding of risk to health. Patient is A&Ox4 and has capacity. Provider [place] notified.

## 2023-08-18 NOTE — Discharge Instructions (Signed)

## 2023-08-18 NOTE — Plan of Care (Signed)
  Problem: Clinical Measurements: Goal: Ability to maintain clinical measurements within normal limits will improve Outcome: Progressing   Problem: Clinical Measurements: Goal: Will remain free from infection Outcome: Progressing   Problem: Clinical Measurements: Goal: Diagnostic test results will improve Outcome: Progressing   Problem: Clinical Measurements: Goal: Cardiovascular complication will be avoided Outcome: Progressing   Problem: Activity: Goal: Risk for activity intolerance will decrease Outcome: Progressing

## 2023-08-18 NOTE — Plan of Care (Signed)

## 2023-08-18 NOTE — Discharge Summary (Signed)
Physician Discharge Summary  Jeff [name]:[id number] DOB: [birth year] DOA: 08/17/2023  PCP: Berniece Salines, FNP  Admit date: 08/17/2023 Discharge date: 08/18/2023  Admitted From: home  Disposition: Pt left AMA  Recommendations for Outpatient Follow-up:  Pt left AMA  Home Health: no  Equipment/Devices:  Discharge Condition: guarded  CODE STATUS: full  Diet recommendation: Regular  Brief/Interim Summary: HPI was taken from Dr. Sedalia Muta:  Jeff [name] is a 27 year old male with history of alcohol use, cocaine use, who presents to Jeff emergency department for chief concerns of dizziness at work.   Vitals in Jeff ED showed temperature of 98, respiration rate of 22, heart rate of 150, blood pressure 149/111, SpO2 of 98% on room air.   Serum sodium is 136, potassium 3.9, chloride 99, bicarb 26, BUN of 9, serum creatinine of 0.89, eGFR greater than 60, nonfasting blood glucose 168.   WBC 11.6, hemoglobin 17, platelets of 363. High sensitive troponin is 3.   ED treatment: Ativan 1 mg IV one-time dose, sodium chloride 1 L bolus ----------------------------- At bedside, patient patient is able to tell me his name, age, location, current calendar year.   Patient was on his mobile device when I entered Jeff room.  He does not [name] to be in acute distress.  He was talking on Jeff phone.   He reports that today while he was at work, he felt dizzy, weak like he was going to pass out.  He denies passing out.  He denies loss of consciousness.  He denies chest pressure, shortness of breath, dysuria, hematuria, diarrhea, swelling of his lower extremities.   He reports he has had symptoms like this before in Jeff past in 2021 Jeff was worked up Jeff was found to have negative cardiac issues.   Reports that over Jeff last 2 to 3 months he has been drinking 10 shots per day Jeff he has started using cocaine intranasally.  He also smokes marijuana.   Social history: He lives at home with his mother Jeff  sister.  He has also started using tobacco products smoking cigarettes.  Daily alcohol use, 10 shots per day.  He works as a [employer] manager.  Jeff he uses cocaine.  As per Dr. Mayford Knife 08/18/23: Pt decided to [facility]   Discharge Diagnoses:  Principal Problem:   Alcohol withdrawal with inpatient treatment [facility]) Active Problems:   Smoker   Alcohol use   Leukocytosis   Sinus tachycardia   Cocaine use   Alcohol withdrawal (HCC)  Alcohol withdrawal: continue on CIWA protocol. Received alcohol cessation counseling already Zofran prn for nausea/vomiting   Illicit drug use: urine drug screen was positive for cocaine, amphetamines, marijuana. Received cessation counseling already    Sinus tachycardia: likely secondary to dehydration & alcohol w/drawal. Continue on IVFs.    Leukocytosis: resolved    Smoker: received cessation counseling x 5 min. Pt refused a nicotine patch    Hyponatremia: will likely improve w/ IVFs       No Known Allergies  Consultations:    Procedures/Studies: DG Chest Port 1 View  Result Date: 08/17/2023 CLINICAL DATA:  Near syncope. History of alcohol Jeff cocaine use. Dizziness at work. EXAM: PORTABLE CHEST 1 VIEW COMPARISON:  Chest radiograph dated 11/07/2018. FINDINGS: No focal consolidation, pleural effusion Jeff pneumothorax. Small left suprahilar probable granuloma. Jeff cardiac silhouette is within normal limits. No acute osseous pathology. IMPRESSION: No active disease. Electronically Signed   By: Elgie Collard M.D.   On: 08/17/2023 19:02   (Echo,  Carotid, EGD, Colonoscopy, ERCP)    Subjective: pt c/o intermittent nausea & vomiting.    Discharge Exam: Vitals:   08/18/23 0401 08/18/23 0824  BP: 122/88 (!) 140/97  Pulse: 74 73  Resp: 16   Temp: 98.1 F (36.7 C) 98 F (36.7 C)  SpO2: 100% 100%   Vitals:   08/17/23 2315 08/17/23 2326 08/18/23 0401 08/18/23 0824  BP: (!) 143/103 137/88 122/88 (!) 140/97  Pulse: 98  74 73  Resp: 16  16   Temp: 98.3 F  (36.8 C)  98.1 F (36.7 C) 98 F (36.7 C)  TempSrc: Oral  Oral Oral  SpO2: 96%  100% 100%  Weight: 63 kg     Height: 6' (1.829 m)       General: Pt is alert, awake, not in acute distress Cardiovascular: S1/S2 +, no rubs, no gallops Respiratory: CTA bilaterally, no wheezing, no rhonchi Abdominal: Soft, NT, ND, bowel sounds + Extremities: no edema, no cyanosis    Jeff results of significant diagnostics from this hospitalization (including imaging, microbiology, ancillary Jeff laboratory) are listed below for reference.     Microbiology: No results found for this Jeff any previous visit (from Jeff past 240 hour(s)).   Labs: BNP (last 3 results) No results for input(s): "BNP" in Jeff last 8760 hours. Basic Metabolic Panel: Recent Labs  Lab 08/17/23 1623 08/18/23 0343  NA 136 134*  K 3.9 3.5  CL 99 101  CO2 26 25  GLUCOSE 168* 104*  BUN 9 8  CREATININE 0.89 0.77  CALCIUM 9.8 8.8*  MG 2.0  --    Liver Function Tests: Recent Labs  Lab 08/17/23 1623  AST 29  ALT 13  ALKPHOS 102  BILITOT 1.2  PROT 8.4*  ALBUMIN 4.7   No results for input(s): "LIPASE", "AMYLASE" in Jeff last 168 hours. No results for input(s): "AMMONIA" in Jeff last 168 hours. CBC: Recent Labs  Lab 08/17/23 1623 08/18/23 0343  WBC 11.6* 9.7  HGB 17.0 13.6  HCT 48.7 39.4  MCV 90.7 91.4  PLT 363 270   Cardiac Enzymes: No results for input(s): "CKTOTAL", "CKMB", "CKMBINDEX", "TROPONINI" in Jeff last 168 hours. BNP: Invalid input(s): "POCBNP" CBG: No results for input(s): "GLUCAP" in Jeff last 168 hours. D-Dimer No results for input(s): "DDIMER" in Jeff last 72 hours. Hgb A1c No results for input(s): "HGBA1C" in Jeff last 72 hours. Lipid Profile No results for input(s): "CHOL", "HDL", "LDLCALC", "TRIG", "CHOLHDL", "LDLDIRECT" in Jeff last 72 hours. Thyroid function studies No results for input(s): "TSH", "T4TOTAL", "T3FREE", "THYROIDAB" in Jeff last 72 hours.  Invalid input(s): "FREET3" Anemia  work up No results for input(s): "VITAMINB12", "FOLATE", "FERRITIN", "TIBC", "IRON", "RETICCTPCT" in Jeff last 72 hours. Urinalysis    Component Value Date/Time   COLORURINE YELLOW (A) 08/17/2023 1933   APPEARANCEUR CLOUDY (A) 08/17/2023 1933   LABSPEC 1.019 08/17/2023 1933   PHURINE 7.0 08/17/2023 1933   GLUCOSEU NEGATIVE 08/17/2023 1933   HGBUR NEGATIVE 08/17/2023 1933   BILIRUBINUR NEGATIVE 08/17/2023 1933   KETONESUR 20 (A) 08/17/2023 1933   PROTEINUR 30 (A) 08/17/2023 1933   NITRITE NEGATIVE 08/17/2023 1933   LEUKOCYTESUR NEGATIVE 08/17/2023 1933   Sepsis Labs Recent Labs  Lab 08/17/23 1623 08/18/23 0343  WBC 11.6* 9.7   Microbiology No results found for this Jeff any previous visit (from Jeff past 240 hour(s)).   Time coordinating discharge: Over 30 minutes  SIGNED:   Charise Killian, MD  [facility] 08/18/2023, 2:52 PM Pager  If 7PM-7AM, please contact night-coverage [email or web address]

## 2023-08-18 NOTE — TOC Initial Note (Signed)
Transition of Care [facility]) - Initial/Assessment Note    Patient Details  Name: Jeff [name] MRN: [id number] Date of Birth: [birth year]  Transition [facility]) CM/SW Contact:    Kemper Durie, RN Phone Number: 08/18/2023, 10:30 AM  Clinical Narrative:                  Patient admitted from home, lives with mom and step dad.  Della Goo, NP is PCP, obtains medications [facility] [address] [facility].  Declines having any DME and declines concern for transportation to appointments or home at discharge.  Agrees to receive substance abuse resources, attached to AVS.   Expected Discharge Plan: Home/Self Care Barriers to Discharge: Continued Medical Work up   Patient Goals and CMS Choice Patient states their goals for this hospitalization and ongoing recovery are:: Home          Expected Discharge Plan and Services       Living arrangements for the past 2 months: Single Family Home                                      Prior Living Arrangements/Services Living arrangements for the past 2 months: Single Family Home Lives with:: Self, Parents Patient language and need for interpreter reviewed:: Yes        Need for Family Participation in Patient Care: Yes (Comment) Care giver support system in place?: Yes (comment)   Criminal Activity/Legal Involvement Pertinent to Current Situation/Hospitalization: No - Comment as needed  Activities of Daily Living Home Assistive Devices/Equipment: None ADL Screening (condition at time of admission) Patient's cognitive ability adequate to safely complete daily activities?: Yes Is the patient deaf or have difficulty hearing?: No Does the patient have difficulty seeing, even when wearing glasses/contacts?: No Does the patient have difficulty concentrating, remembering, or making decisions?: No Patient able to express need for assistance with ADLs?: Yes Does the patient have difficulty dressing or bathing?: No Independently performs ADLs?:  Yes (appropriate for developmental age) Does the patient have difficulty walking or climbing stairs?: No Weakness of Legs: None Weakness of Arms/Hands: None  Permission Sought/Granted                  Emotional Assessment       Orientation: : Oriented to Self, Oriented to Place, Oriented to  Time, Oriented to Situation Alcohol / Substance Use: Alcohol Use Psych Involvement: No (comment)  Admission diagnosis:  Alcohol withdrawal (HCC) [F10.939] Lightheadedness [R42] Alcohol withdrawal with inpatient treatment (HCC) [F10.939] Alcohol withdrawal syndrome without complication (HCC) [F10.930] Patient Active Problem List   Diagnosis Date Noted   Alcohol withdrawal with inpatient treatment (HCC) 08/17/2023   Alcohol use 08/17/2023   Leukocytosis 08/17/2023   Sinus tachycardia 08/17/2023   Cocaine use 08/17/2023   Alcohol withdrawal (HCC) 08/17/2023   Marijuana abuse 07/16/2018   Smoker 07/16/2018   PCP:  Berniece Salines, FNP [facility] [address] [place] [facility] [address] [place] [address] Phone: [phone] Fax: [phone]     Social Determinants of Health (SDOH) Social History: SDOH Screenings   Food Insecurity: No Food Insecurity (08/17/2023)  Housing: Low Risk  (08/17/2023)  Transportation Needs: No Transportation Needs (08/17/2023)  Utilities: Not At Risk (08/17/2023)  Alcohol Screen: Low Risk  (04/16/2023)  Depression (PHQ2-9): Low Risk  (04/16/2023)  Financial Resource Strain: Low Risk  (04/16/2023)  Physical Activity: Inactive (04/16/2023)  Social Connections: Socially Isolated (04/16/2023)  Stress: No Stress Concern Present (04/16/2023)  Tobacco Use: High Risk (08/17/2023)   SDOH Interventions:     Readmission Risk Interventions     No data to display

## 2023-08-19 ENCOUNTER — Telehealth: Payer: Self-pay

## 2023-08-19 LAB — GLUCOSE, CAPILLARY: Glucose-Capillary: 154 mg/dL — ABNORMAL HIGH (ref 70–99)

## 2023-08-19 NOTE — Transitions of Care (Post Inpatient/ED Visit) (Unsigned)
   08/19/2023  Name: TAQUON AMICUCCI MRN: [id number] DOB: [birth year]  Today's TOC FU Call Status: Today's TOC FU Call Status:: Unsuccessful Call (1st Attempt) Unsuccessful Call (1st Attempt) Date: 08/19/23  Attempted to reach the patient regarding the most recent Inpatient/ED visit.  Follow Up Plan: Additional outreach attempts will be made to reach the patient to complete the Transitions of Care (Post Inpatient/ED visit) call.   Signature Karena Addison, LPN [facility] Nurse Health Advisor Direct Dial [phone]

## 2023-08-20 NOTE — Transitions of Care (Post Inpatient/ED Visit) (Signed)
   08/20/2023  Name: DETRICK LOCKERMAN MRN: [id number] DOB: [birth year]  Today's TOC FU Call Status: Today's TOC FU Call Status:: Unsuccessful Call (2nd Attempt) Unsuccessful Call (1st Attempt) Date: 08/19/23 Unsuccessful Call (2nd Attempt) Date: 08/20/23  Attempted to reach the patient regarding the most recent Inpatient/ED visit.  Follow Up Plan: No further outreach attempts will be made at this time. We have been unable to contact the patient. Patient hung up when called Signature  Karena Addison, LPN [facility] Nurse Health Advisor Direct Dial [phone]

## 2023-12-09 ENCOUNTER — Emergency Department: Payer: MEDICAID

## 2023-12-09 ENCOUNTER — Emergency Department
Admission: EM | Admit: 2023-12-09 | Discharge: 2023-12-09 | Discharge status: Against Medical Advice | Payer: MEDICAID | Attending: Emergency Medicine | Admitting: Emergency Medicine

## 2023-12-09 ENCOUNTER — Other Ambulatory Visit: Payer: Self-pay

## 2023-12-09 DIAGNOSIS — Z5321 Procedure and treatment not carried out due to patient leaving prior to being seen by health care provider: Secondary | ICD-10-CM | POA: Insufficient documentation

## 2023-12-09 DIAGNOSIS — R42 Dizziness and giddiness: Secondary | ICD-10-CM | POA: Insufficient documentation

## 2023-12-09 DIAGNOSIS — R519 Headache, unspecified: Secondary | ICD-10-CM | POA: Insufficient documentation

## 2023-12-09 LAB — URINALYSIS, ROUTINE W REFLEX MICROSCOPIC
Bilirubin Urine: NEGATIVE
Glucose, UA: NEGATIVE mg/dL
Hgb urine dipstick: NEGATIVE
Ketones, ur: NEGATIVE mg/dL
Leukocytes,Ua: NEGATIVE
Nitrite: NEGATIVE
Protein, ur: NEGATIVE mg/dL
Specific Gravity, Urine: 1.01 (ref 1.005–1.030)
pH: 7 (ref 5.0–8.0)

## 2023-12-09 LAB — CBC
HCT: 51.2 % (ref 39.0–52.0)
Hemoglobin: 17.4 g/dL — ABNORMAL HIGH (ref 13.0–17.0)
MCH: 30.9 pg (ref 26.0–34.0)
MCHC: 34 g/dL (ref 30.0–36.0)
MCV: 90.8 fL (ref 80.0–100.0)
Platelets: 327 10*3/uL (ref 150–400)
RBC: 5.64 MIL/uL (ref 4.22–5.81)
RDW: 12.3 % (ref 11.5–15.5)
WBC: 9.6 10*3/uL (ref 4.0–10.5)
nRBC: 0 % (ref 0.0–0.2)

## 2023-12-09 LAB — BASIC METABOLIC PANEL
Anion gap: 11 (ref 5–15)
BUN: 9 mg/dL (ref 6–20)
CO2: 27 mmol/L (ref 22–32)
Calcium: 9 mg/dL (ref 8.9–10.3)
Chloride: 101 mmol/L (ref 98–111)
Creatinine, Ser: 0.78 mg/dL (ref 0.61–1.24)
GFR, Estimated: >60 mL/min (ref >60)
Glucose, Bld: 115 mg/dL — ABNORMAL HIGH (ref 70–99)
Potassium: 3.6 mmol/L (ref 3.5–5.1)
Sodium: 139 mmol/L (ref 135–145)
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
## 2023-12-09 NOTE — ED Notes (Signed)
Called x1

## 2023-12-09 NOTE — ED Notes (Signed)
Called no answer in lobby  

## 2024-01-06 ENCOUNTER — Ambulatory Visit: Payer: Self-pay | Admitting: Nurse Practitioner

## 2024-01-06 DIAGNOSIS — Z113 Encounter for screening for infections with a predominantly sexual mode of transmission: Secondary | ICD-10-CM

## 2024-01-10 ENCOUNTER — Encounter: Payer: Self-pay | Admitting: Emergency Medicine

## 2024-01-10 ENCOUNTER — Emergency Department
Admission: EM | Admit: 2024-01-10 | Discharge: 2024-01-10 | Discharge status: Against Medical Advice | Payer: MEDICAID | Attending: Emergency Medicine | Admitting: Emergency Medicine

## 2024-01-10 ENCOUNTER — Other Ambulatory Visit: Payer: Self-pay

## 2024-01-10 DIAGNOSIS — Z5321 Procedure and treatment not carried out due to patient leaving prior to being seen by health care provider: Secondary | ICD-10-CM | POA: Insufficient documentation

## 2024-01-10 DIAGNOSIS — X58XXXA Exposure to other specified factors, initial encounter: Secondary | ICD-10-CM | POA: Insufficient documentation

## 2024-01-10 DIAGNOSIS — S6992XA Unspecified injury of left wrist, hand and finger(s), initial encounter: Secondary | ICD-10-CM | POA: Insufficient documentation

## 2024-01-10 NOTE — ED Notes (Signed)
Pt now refusing vs and st he no longer wishes to be seen by a provider
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
## 2024-01-14 ENCOUNTER — Ambulatory Visit: Payer: Self-pay

## 2024-02-01 NOTE — Progress Notes (Signed)
 Patient was roomed by RN and left prior to provider encounter.  Clarita L. Candiace West, FNP-C

## 2024-05-20 ENCOUNTER — Ambulatory Visit: Payer: Self-pay | Admitting: Family Medicine

## 2024-05-20 DIAGNOSIS — Z113 Encounter for screening for infections with a predominantly sexual mode of transmission: Secondary | ICD-10-CM

## 2024-05-20 LAB — HM HIV SCREENING LAB: HM HIV Screening: NEGATIVE

## 2024-05-20 LAB — HEPATITIS B SURFACE ANTIGEN: Hepatitis B Surface Ag: NONREACTIVE

## 2024-05-20 NOTE — Progress Notes (Signed)
 [facility] Department STI clinic [address] [place] [address] Main phone: [phone]  STI screening visit  Subjective:  HASON OFARRELL is a 28 y.o. male being seen today for an STI screening visit. The patient reports they do have symptoms.    Patient has the following medical conditions:  Patient Active Problem List   Diagnosis Date Noted   Alcohol withdrawal with inpatient treatment (HCC) 08/17/2023   Alcohol use 08/17/2023   Leukocytosis 08/17/2023   Sinus tachycardia 08/17/2023   Cocaine use 08/17/2023   Alcohol withdrawal (HCC) 08/17/2023   Marijuana abuse 07/16/2018   Smoker 07/16/2018   Chief Complaint  Patient presents with   SEXUALLY TRANSMITTED DISEASE   HPI Patient reports desiring STI screening. Has two week history of intermittent sore throat. One day of dysuria. No specific exposures to STI that he knows of. Denies penile discharge, sores, and rashes.   See flowsheet for further details and programmatic requirements  Hyperlink available at the top of the signed note in blue.  Flow sheet content below:  Pregnancy Intention Screening Does the patient want to become pregnant in the next year?: No Does the patient's partner want to become pregnant in the next year?: No Would the patient like to discuss contraceptive options today?: N/A Risk Factors for Hep B Household, sexual, or needle sharing contact of a person infected with Hep B: No Sexual contact with a person who uses drugs not as prescribed?: No Currently or Ever used drugs not as prescribed: No HIV Positive: No PRep Patient: No Men who have sex with men: Yes Have Hepatitis C: No History of Incarceration: Yes History of Homeslessness?: No Anal sex following anal drug use?: N/A Risk Factors for Hep C Currently using drugs not as prescribed: No Sexual partner(s) currently using drugs as not prescribed: No History of drug use: No HIV Positive: No People  with a history of incarceration: No People born between the years of 18 and 18: No Abuse History Has patient ever been abused physically?: No Has patient ever been abused sexually?: No Does patient feel they have a problem with Anxiety?: No Does patient feel they have a problem with Depression?: No Referral to Behavioral Health: No Counseling Patient counseled to use condoms with all sex: Condoms declined RTC in 2-3 weeks for test results: Yes Clinic will call if test results abnormal before test result appt.: Yes Test results given to patient Patient counseled to use condoms with all sex: Condoms declined  Screening for MPX risk: Does the patient have an unexplained rash? No Is the patient MSM? Yes Does the patient endorse multiple sex partners or anonymous sex partners? Yes Did the patient have close or sexual contact with a person diagnosed with MPX? No Has the patient traveled outside the [place]  where MPX is endemic? No Is there a high clinical suspicion for MPX-- evidenced by one of the following No  -Unlikely to be chickenpox  -Lymphadenopathy  -Rash that present in same phase of evolution on any given body part  STI screening history: Last HIV test per patient/review of record was No results found for: "HMHIVSCREEN"  Lab Results  Component Value Date   HIV NON-REACTIVE 04/10/2023   Last HEPC test per patient/review of record was No results found for: "HMHEPCSCREEN" No components found for: "HEPC"   Last HEPB test per patient/review of record was No components found for: "HMHEPBSCREEN"   Fertility: Does the patient or their partner desires a pregnancy in  the next year? No  There is no immunization history on file for this patient.  The following portions of the patient's history were reviewed and updated as appropriate: allergies, current medications, past medical history, past social history, past surgical history and problem list.  Objective:  There were no vitals  filed for this visit.  Physical Exam Exam conducted with a chaperone present Leodis Rainwater, NP student).  Constitutional:      Appearance: Normal appearance.  HENT:     Head: Normocephalic and atraumatic.     Comments: No nits or hair loss    Nose: No congestion.     Mouth/Throat:     Mouth: Mucous membranes are moist. No oral lesions.     Pharynx: Oropharynx is clear. No oropharyngeal exudate or posterior oropharyngeal erythema.  Eyes:     General: No scleral icterus.       Right eye: No discharge.        Left eye: No discharge.     Conjunctiva/sclera: Conjunctivae normal.     Right eye: Right conjunctiva is not injected. No exudate.    Left eye: Left conjunctiva is not injected. No exudate. Pulmonary:     Effort: Pulmonary effort is normal.  Abdominal:     Palpations: There is no hepatomegaly.  Genitourinary:    Pubic Area: Pubic lice: no nits.     Rectum: No mass, tenderness (no lesions or discharge), anal fissure or external hemorrhoid.  Musculoskeletal:        General: Normal range of motion.     Cervical back: Neck supple. No rigidity or tenderness.  Lymphadenopathy:     Head:     Right side of head: No submandibular, preauricular or posterior auricular adenopathy.     Left side of head: No submandibular, preauricular or posterior auricular adenopathy.     Cervical: No cervical adenopathy.     Right cervical: No superficial or posterior cervical adenopathy.    Left cervical: No superficial or posterior cervical adenopathy.     Upper Body:     Right upper body: No supraclavicular adenopathy.     Left upper body: No supraclavicular adenopathy.  Skin:    General: Skin is warm and dry.     Capillary Refill: Capillary refill takes less than 2 seconds.     Coloration: Skin is not jaundiced or pale.     Findings: No bruising, erythema, lesion or rash.  Neurological:     General: No focal deficit present.     Mental Status: He is alert and oriented to person, place, and time.   Psychiatric:        Mood and Affect: Mood normal.        Behavior: Behavior normal.    Assessment and Plan:  ACY ORSAK is a 28 y.o. male presenting to the [facility] Department for STI screening  Screening examination for venereal disease -     Chlamydia/GC NAA, Confirmation -     Syphilis Serology, [place] [facility] -     HIV [place] [facility] -     HBV Antigen/Antibody [facility] -     Chlamydia/Gonorrhea [place] [facility] -     Chlamydia/Gonorrhea  Lab   Patient does have STI symptoms Patient accepted the following screenings: oral CT/GC NAAT swab, HIV, RPR, and Hep B, rectal G/C swab Patient meets criteria for HepB screening? Yes. Ordered? yes Patient meets criteria for HepC screening? No. Ordered? not applicable Recommended condom use with all sex  Discussed importance of condom use for STI prevention  Treat positive test results per standing order. Discussed time line for [facility] results and that patient will be called with positive results and encouraged patient to call if he had not heard in 2 weeks Recommended repeat testing in 3 months with positive results. Recommended returning for continued or worsening symptoms.   No follow-ups on file.  No future appointments.  Jack Marts, MD

## 2024-05-20 NOTE — Patient Instructions (Signed)
 STI screening - Today we obtained a urine sample to screen for gonorrhea and chlamydia and oral swab to screen for gonorrhea and chlamydia - We also obtained a blood sample to screen for HIV, syphilis, and Hepatitis B - If the results are abnormal, I will give you a call.    Estimated time frame for results collected at the [facility] Department: Same day Trichomonas Yeast BV (bacterial vaginosis)  Within 1-2 weeks Gonorrhea Chlamydia  Within 2-3 weeks HIV Syphilis Hepatitis B Hepatitis C

## 2024-05-22 LAB — CHLAMYDIA/GC NAA, CONFIRMATION
Chlamydia trachomatis, NAA: NEGATIVE
Neisseria gonorrhoeae, NAA: NEGATIVE
